# Patient Record
Sex: Female | Born: 1980 | Race: White | Hispanic: No | Marital: Single | Smoking: Current every day smoker
Health system: Southern US, Community
[De-identification: ages and names within clinical notes are randomized; demographics above are authoritative.]

## PROBLEM LIST (undated history)

## (undated) DIAGNOSIS — R768 Other specified abnormal immunological findings in serum: Secondary | ICD-10-CM

## (undated) DIAGNOSIS — Z9889 Other specified postprocedural states: Secondary | ICD-10-CM

## (undated) DIAGNOSIS — Z8742 Personal history of other diseases of the female genital tract: Secondary | ICD-10-CM

## (undated) DIAGNOSIS — M6282 Rhabdomyolysis: Secondary | ICD-10-CM

## (undated) DIAGNOSIS — F419 Anxiety disorder, unspecified: Secondary | ICD-10-CM

## (undated) DIAGNOSIS — F17200 Nicotine dependence, unspecified, uncomplicated: Secondary | ICD-10-CM

## (undated) DIAGNOSIS — K469 Unspecified abdominal hernia without obstruction or gangrene: Secondary | ICD-10-CM

## (undated) DIAGNOSIS — N72 Inflammatory disease of cervix uteri: Secondary | ICD-10-CM

## (undated) DIAGNOSIS — R569 Unspecified convulsions: Secondary | ICD-10-CM

## (undated) DIAGNOSIS — F1910 Other psychoactive substance abuse, uncomplicated: Secondary | ICD-10-CM

## (undated) DIAGNOSIS — D649 Anemia, unspecified: Secondary | ICD-10-CM

## (undated) DIAGNOSIS — N83209 Unspecified ovarian cyst, unspecified side: Secondary | ICD-10-CM

## (undated) HISTORY — DX: Nicotine dependence, unspecified, uncomplicated: F17.200

## (undated) HISTORY — PX: WISDOM TOOTH EXTRACTION: SHX21

## (undated) HISTORY — PX: TYMPANOSTOMY TUBE PLACEMENT: SHX32

---
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
## 2001-02-02 ENCOUNTER — Inpatient Hospital Stay (HOSPITAL_COMMUNITY): Admission: AD | Admit: 2001-02-02 | Discharge: 2001-02-02 | Payer: Self-pay | Admitting: Gynecology

## 2001-02-16 ENCOUNTER — Inpatient Hospital Stay (HOSPITAL_COMMUNITY): Admission: AD | Admit: 2001-02-16 | Discharge: 2001-02-19 | Payer: Self-pay | Admitting: Gynecology

## 2001-02-16 ENCOUNTER — Encounter: Payer: Self-pay | Admitting: Gynecology

## 2001-04-06 ENCOUNTER — Other Ambulatory Visit: Admission: RE | Admit: 2001-04-06 | Discharge: 2001-04-06 | Payer: Self-pay | Admitting: Obstetrics and Gynecology

## 2001-11-09 ENCOUNTER — Other Ambulatory Visit: Admission: RE | Admit: 2001-11-09 | Discharge: 2001-11-09 | Payer: Self-pay | Admitting: Obstetrics and Gynecology

## 2002-07-19 ENCOUNTER — Encounter: Payer: Self-pay | Admitting: Emergency Medicine

## 2002-07-19 ENCOUNTER — Emergency Department (HOSPITAL_COMMUNITY): Admission: EM | Admit: 2002-07-19 | Discharge: 2002-07-19 | Payer: Self-pay | Admitting: Emergency Medicine

## 2003-02-09 ENCOUNTER — Emergency Department (HOSPITAL_COMMUNITY): Admission: EM | Admit: 2003-02-09 | Discharge: 2003-02-09 | Payer: Self-pay | Admitting: Emergency Medicine

## 2003-04-01 ENCOUNTER — Emergency Department (HOSPITAL_COMMUNITY): Admission: EM | Admit: 2003-04-01 | Discharge: 2003-04-02 | Payer: Self-pay | Admitting: Emergency Medicine

## 2003-08-29 ENCOUNTER — Emergency Department (HOSPITAL_COMMUNITY): Admission: EM | Admit: 2003-08-29 | Discharge: 2003-08-29 | Payer: Self-pay | Admitting: Emergency Medicine

## 2004-02-09 ENCOUNTER — Emergency Department (HOSPITAL_COMMUNITY): Admission: EM | Admit: 2004-02-09 | Discharge: 2004-02-09 | Payer: Self-pay | Admitting: Emergency Medicine

## 2004-09-09 ENCOUNTER — Emergency Department (HOSPITAL_COMMUNITY): Admission: EM | Admit: 2004-09-09 | Discharge: 2004-09-10 | Payer: Self-pay | Admitting: Emergency Medicine

## 2004-10-06 ENCOUNTER — Emergency Department (HOSPITAL_COMMUNITY): Admission: EM | Admit: 2004-10-06 | Discharge: 2004-10-06 | Payer: Self-pay | Admitting: Emergency Medicine

## 2005-07-22 ENCOUNTER — Inpatient Hospital Stay (HOSPITAL_COMMUNITY): Admission: EM | Admit: 2005-07-22 | Discharge: 2005-07-23 | Payer: Self-pay | Admitting: Emergency Medicine

## 2005-07-22 ENCOUNTER — Encounter: Payer: Self-pay | Admitting: Emergency Medicine

## 2005-08-25 ENCOUNTER — Emergency Department (HOSPITAL_COMMUNITY): Admission: EM | Admit: 2005-08-25 | Discharge: 2005-08-25 | Payer: Self-pay | Admitting: *Deleted

## 2010-05-22 ENCOUNTER — Emergency Department (HOSPITAL_COMMUNITY)
Admission: EM | Admit: 2010-05-22 | Discharge: 2010-05-22 | Disposition: A | Discharge status: Home / Self Care | Payer: Self-pay | Attending: Emergency Medicine | Admitting: Emergency Medicine

## 2010-05-22 DIAGNOSIS — IMO0001 Reserved for inherently not codable concepts without codable children: Secondary | ICD-10-CM | POA: Insufficient documentation

## 2010-05-22 DIAGNOSIS — M79609 Pain in unspecified limb: Secondary | ICD-10-CM | POA: Insufficient documentation

## 2010-08-13 ENCOUNTER — Emergency Department (HOSPITAL_COMMUNITY)
Admission: EM | Admit: 2010-08-13 | Discharge: 2010-08-13 | Disposition: A | Discharge status: Home / Self Care | Payer: Self-pay | Attending: Emergency Medicine | Admitting: Emergency Medicine

## 2010-08-13 ENCOUNTER — Emergency Department (HOSPITAL_COMMUNITY): Payer: Self-pay

## 2010-08-13 ENCOUNTER — Encounter (HOSPITAL_COMMUNITY): Payer: Self-pay

## 2010-08-13 DIAGNOSIS — R229 Localized swelling, mass and lump, unspecified: Secondary | ICD-10-CM | POA: Insufficient documentation

## 2010-08-13 DIAGNOSIS — R51 Headache: Secondary | ICD-10-CM | POA: Insufficient documentation

## 2010-08-13 DIAGNOSIS — F17200 Nicotine dependence, unspecified, uncomplicated: Secondary | ICD-10-CM | POA: Insufficient documentation

## 2010-08-13 LAB — POCT I-STAT, CHEM 8
Calcium, Ion: 1.22 mmol/L (ref 1.12–1.32)
Chloride: 104 mEq/L (ref 96–112)
Creatinine, Ser: 0.7 mg/dL (ref 0.4–1.2)
Glucose, Bld: 92 mg/dL (ref 70–99)
HCT: 43 % (ref 36.0–46.0)
Hemoglobin: 14.6 g/dL (ref 12.0–15.0)
Potassium: 4.2 mEq/L (ref 3.5–5.1)
TCO2: 26 mmol/L (ref 0–100)

## 2010-08-13 LAB — DIFFERENTIAL
Basophils Absolute: 0.1 10*3/uL (ref 0.0–0.1)
Eosinophils Absolute: 0.2 10*3/uL (ref 0.0–0.7)
Eosinophils Relative: 2 % (ref 0–5)
Lymphocytes Relative: 32 % (ref 12–46)
Lymphs Abs: 3.5 10*3/uL (ref 0.7–4.0)
Neutro Abs: 6.4 10*3/uL (ref 1.7–7.7)
Neutrophils Relative %: 58 % (ref 43–77)

## 2010-08-13 LAB — CBC
RDW: 13.4 % (ref 11.5–15.5)
WBC: 11 10*3/uL — ABNORMAL HIGH (ref 4.0–10.5)

## 2010-08-31 ENCOUNTER — Emergency Department (HOSPITAL_COMMUNITY)
Admission: EM | Admit: 2010-08-31 | Discharge: 2010-09-01 | Disposition: A | Discharge status: Home / Self Care | Payer: Self-pay | Attending: Emergency Medicine | Admitting: Emergency Medicine

## 2010-08-31 ENCOUNTER — Emergency Department (HOSPITAL_COMMUNITY): Payer: Self-pay

## 2010-08-31 DIAGNOSIS — N83209 Unspecified ovarian cyst, unspecified side: Secondary | ICD-10-CM | POA: Insufficient documentation

## 2010-08-31 DIAGNOSIS — R6883 Chills (without fever): Secondary | ICD-10-CM | POA: Insufficient documentation

## 2010-08-31 DIAGNOSIS — N949 Unspecified condition associated with female genital organs and menstrual cycle: Secondary | ICD-10-CM | POA: Insufficient documentation

## 2010-08-31 DIAGNOSIS — R110 Nausea: Secondary | ICD-10-CM | POA: Insufficient documentation

## 2010-08-31 DIAGNOSIS — R1032 Left lower quadrant pain: Secondary | ICD-10-CM | POA: Insufficient documentation

## 2010-08-31 DIAGNOSIS — B9689 Other specified bacterial agents as the cause of diseases classified elsewhere: Secondary | ICD-10-CM | POA: Insufficient documentation

## 2010-08-31 DIAGNOSIS — R300 Dysuria: Secondary | ICD-10-CM | POA: Insufficient documentation

## 2010-08-31 DIAGNOSIS — A499 Bacterial infection, unspecified: Secondary | ICD-10-CM | POA: Insufficient documentation

## 2010-08-31 DIAGNOSIS — N760 Acute vaginitis: Secondary | ICD-10-CM | POA: Insufficient documentation

## 2010-08-31 LAB — CBC
HCT: 36.7 % (ref 36.0–46.0)
Hemoglobin: 12.6 g/dL (ref 12.0–15.0)
MCH: 29.7 pg (ref 26.0–34.0)
MCHC: 34.3 g/dL (ref 30.0–36.0)
MCV: 86.6 fL (ref 78.0–100.0)
Platelets: 200 10*3/uL (ref 150–400)
RDW: 13.5 % (ref 11.5–15.5)

## 2010-08-31 LAB — WET PREP, GENITAL
Trich, Wet Prep: NONE SEEN
Yeast Wet Prep HPF POC: NONE SEEN

## 2010-08-31 LAB — BASIC METABOLIC PANEL
Chloride: 102 mEq/L (ref 96–112)
Glucose, Bld: 86 mg/dL (ref 70–99)
Sodium: 138 mEq/L (ref 135–145)

## 2010-08-31 LAB — DIFFERENTIAL
Basophils Relative: 1 % (ref 0–1)
Eosinophils Absolute: 0.2 10*3/uL (ref 0.0–0.7)
Lymphs Abs: 3.7 10*3/uL (ref 0.7–4.0)
Monocytes Absolute: 0.7 10*3/uL (ref 0.1–1.0)

## 2010-09-01 ENCOUNTER — Emergency Department (HOSPITAL_COMMUNITY): Payer: Self-pay

## 2010-09-01 ENCOUNTER — Encounter (HOSPITAL_COMMUNITY): Payer: Self-pay | Admitting: Radiology

## 2010-09-01 LAB — GC/CHLAMYDIA PROBE AMP, GENITAL: Chlamydia, DNA Probe: NEGATIVE

## 2010-09-01 LAB — URINALYSIS, ROUTINE W REFLEX MICROSCOPIC
Bilirubin Urine: NEGATIVE
Hgb urine dipstick: NEGATIVE
Ketones, ur: 40 mg/dL — AB
Nitrite: NEGATIVE
Urobilinogen, UA: 0.2 mg/dL (ref 0.0–1.0)
pH: 7.5 (ref 5.0–8.0)

## 2010-09-01 MED ORDER — IOHEXOL 300 MG/ML  SOLN
80.0000 mL | Freq: Once | INTRAMUSCULAR | Status: AC | PRN
  Administered 2010-09-01: 80 mL via INTRAVENOUS

## 2010-10-28 ENCOUNTER — Inpatient Hospital Stay (HOSPITAL_COMMUNITY)
Admission: AD | Admit: 2010-10-28 | Discharge: 2010-10-28 | Disposition: A | Discharge status: Home / Self Care | Payer: Self-pay | Source: Ambulatory Visit | Attending: Gynecology | Admitting: Gynecology

## 2010-10-28 ENCOUNTER — Inpatient Hospital Stay (HOSPITAL_COMMUNITY): Payer: Self-pay

## 2010-10-28 ENCOUNTER — Encounter (HOSPITAL_COMMUNITY): Payer: Self-pay | Admitting: *Deleted

## 2010-10-28 DIAGNOSIS — R110 Nausea: Secondary | ICD-10-CM | POA: Insufficient documentation

## 2010-10-28 DIAGNOSIS — M545 Low back pain, unspecified: Secondary | ICD-10-CM | POA: Insufficient documentation

## 2010-10-28 DIAGNOSIS — R1032 Left lower quadrant pain: Secondary | ICD-10-CM | POA: Insufficient documentation

## 2010-10-28 DIAGNOSIS — N83209 Unspecified ovarian cyst, unspecified side: Secondary | ICD-10-CM

## 2010-10-28 DIAGNOSIS — F17200 Nicotine dependence, unspecified, uncomplicated: Secondary | ICD-10-CM | POA: Insufficient documentation

## 2010-10-28 LAB — WET PREP, GENITAL
Trich, Wet Prep: NONE SEEN
Yeast Wet Prep HPF POC: NONE SEEN

## 2010-10-28 LAB — URINALYSIS, ROUTINE W REFLEX MICROSCOPIC
Bilirubin Urine: NEGATIVE
Glucose, UA: NEGATIVE mg/dL
Hgb urine dipstick: NEGATIVE
Ketones, ur: NEGATIVE mg/dL
Protein, ur: NEGATIVE mg/dL
pH: 6.5 (ref 5.0–8.0)

## 2010-10-28 LAB — CBC
MCH: 29.6 pg (ref 26.0–34.0)
RBC: 4.09 MIL/uL (ref 3.87–5.11)
RDW: 13.2 % (ref 11.5–15.5)
WBC: 13.2 10*3/uL — ABNORMAL HIGH (ref 4.0–10.5)

## 2010-10-28 LAB — POCT PREGNANCY, URINE: Preg Test, Ur: NEGATIVE

## 2010-10-28 MED ORDER — IBUPROFEN 600 MG PO TABS: 600.0000 mg | ORAL_TABLET | Freq: Four times a day (QID) | ORAL | Status: AC | PRN

## 2010-10-28 MED ORDER — OXYCODONE-ACETAMINOPHEN 5-325 MG PO TABS
1.0000 | ORAL_TABLET | Freq: Four times a day (QID) | ORAL | Status: AC | PRN
Start: 2010-10-28 — End: 2010-11-07

## 2010-10-28 MED ORDER — OXYCODONE-ACETAMINOPHEN 5-325 MG PO TABS
2.0000 | ORAL_TABLET | Freq: Once | ORAL | Status: AC
  Administered 2010-10-28: 2 via ORAL
  Filled 2010-10-28: qty 2

## 2010-10-28 MED ORDER — KETOROLAC TROMETHAMINE 60 MG/2ML IM SOLN
60.0000 mg | Freq: Once | INTRAMUSCULAR | Status: AC
  Administered 2010-10-28: 60 mg via INTRAMUSCULAR
  Filled 2010-10-28: qty 2

## 2010-10-28 NOTE — ED Notes (Signed)
Pt dozing and feels some better. Abdomen is "sore".

## 2010-10-28 NOTE — Progress Notes (Signed)
G1P1 hx C/S 2002. Hx ovarian cysts. Seen [facility] 2mos ago and told had "chocolate cyst". Awoke with pain LLQ which radiates L hip and L lower back. Sharp and crampy. Feels like pain from cyst. LMP 7/6. Has tried Lortabs which help make pain tolerable.

## 2010-10-28 NOTE — ED Notes (Signed)
0420Written and verbal d/c instructions given and understanding vocied. To f/u with MD's office on Monday.

## 2010-10-28 NOTE — ED Notes (Signed)
Ice to right hip where hip is sore from Toradol inj.

## 2010-10-28 NOTE — ED Provider Notes (Signed)
Adequate pain relief w/ Percocet.  Reviewed [place]. D/C home.

## 2010-10-28 NOTE — Progress Notes (Signed)
Dorathy Kinsman CNM in to see pt. Spec exam done and wet prep and GC/Chlam obtained.

## 2010-10-28 NOTE — ED Provider Notes (Signed)
History   The pt is a 30 year-old female who presents to [facility]U reporting LLQ and left low back pain since 0900 10/27/10 similar to ovarian cyst pain she has experienced before, but lasting longer. Pain was relieved by Lortab yesterday, but she ran out.  She called the on-call provider for pain meds and he told her to go to [facility]U for eval. Pt denies high-risk sexual behavior.  Chief Complaint  Patient presents with  . Abdominal Pain   HPI   Past Medical History  Diagnosis Date  . Cyst of ovary     Past Surgical History  Procedure Date  . Cesarean section     No family history on file.  History  Substance Use Topics  . Smoking status: Current Everyday Smoker -- 0.5 packs/day  . Smokeless tobacco: Not on file  . Alcohol Use: No    Allergies: No Known Allergies  Prescriptions prior to admission  Medication Sig Dispense Refill  . HYDROcodone-acetaminophen (LORTAB) 7.5-500 MG per tablet Take 1 tablet by mouth every 6 (six) hours as needed. For pain       . lidocaine (XYLOCAINE) 2 % jelly Apply 1 application topically as needed.          Review of Systems  Constitutional: Negative.   Gastrointestinal: Positive for nausea (mild x 1 day) and abdominal pain. Negative for vomiting, diarrhea, constipation and blood in stool.  Musculoskeletal: Positive for back pain.   Physical Exam   Blood pressure 132/91, pulse 81, temperature 98 F (36.7 C), temperature source Oral, resp. rate 20, height 5\' 5"  (1.651 m), weight 55.702 kg (122 lb 12.8 oz).  *RADIOLOGY REPORT*  Clinical Data: Left lower quadrant pain, history of ovarian cyst.  TRANSVAGINAL ULTRASOUND OF PELVIS  Technique: Transvaginal ultrasound examination of the pelvis was  performed including evaluation of the uterus, ovaries, adnexal  regions, and pelvic cul-de-sac.  Comparison: 09/01/2010  Findings:  Uterus the uterus appears normal, measuring 6.2 x 4.1 x 3.5 cm.  Retroverted.  Endometrium normal endometrial thickness and  appearance, measuring  7 mm.  Right Ovary the right ovary is normal in size and echogenicity,  measuring 3.3 x 1.3 x 1.3 cm.  Left Ovary the left ovary measures 4.2 x 1.8 x 2.3 cm.  Multiloculated cystic area arising from or adjacent to the left  ovary is without significant interval change, measuring 2.1 x 3.5  cm. Within the left ovary is a 2.0 x 1.5 cm echogenic focus which  may represent a hemorrhagic cyst.  Other Findings: Trace free fluid.  IMPRESSION:  Unchanged appearance of the complex left adnexal cyst, measures 3.5  x 2.1 cm. Again may represent a hemorrhagic cyst.  Original Report Authenticated By: Waneta Martins, M.D.  Results for orders placed during the hospital encounter of 10/28/10 (from the past 24 hour(s))  POCT PREGNANCY, URINE     Status: Normal   Collection Time   10/28/10 12:59 AM      Component Value Range   Preg Test, Ur NEGATIVE    URINALYSIS, ROUTINE W REFLEX MICROSCOPIC     Status: Normal   Collection Time   10/28/10  1:00 AM      Component Value Range   Color, Urine YELLOW  YELLOW    Appearance CLEAR  CLEAR    Specific Gravity, Urine 1.015  1.005 - 1.030    pH 6.5  5.0 - 8.0    Glucose, UA NEGATIVE  NEGATIVE (mg/dL)   Hgb urine dipstick NEGATIVE  NEGATIVE    Bilirubin Urine NEGATIVE  NEGATIVE    Ketones, ur NEGATIVE  NEGATIVE (mg/dL)   Protein, ur NEGATIVE  NEGATIVE (mg/dL)   Urobilinogen, UA 0.2  0.0 - 1.0 (mg/dL)   Nitrite NEGATIVE  NEGATIVE    Leukocytes, UA NEGATIVE  NEGATIVE   CBC     Status: Abnormal   Collection Time   10/28/10  1:37 AM      Component Value Range   WBC 13.2 (*) 4.0 - 10.5 (K/uL)   RBC 4.09  3.87 - 5.11 (MIL/uL)   Hemoglobin 12.1  12.0 - 15.0 (g/dL)   HCT 69.6 (*) 29.5 - 46.0 (%)   MCV 87.5  78.0 - 100.0 (fL)   MCH 29.6  26.0 - 34.0 (pg)   MCHC 33.8  30.0 - 36.0 (g/dL)   RDW 28.4  13.2 - 44.0 (%)   Platelets 210  150 - 400 (K/uL)  WET PREP, GENITAL     Status: Abnormal   Collection Time   10/28/10  2:45 AM       Component Value Range   Yeast, Wet Prep NONE SEEN  NONE SEEN    Trich, Wet Prep NONE SEEN  NONE SEEN    Clue Cells, Wet Prep NONE SEEN  NONE SEEN    WBC, Wet Prep HPF POC FEW (*) NONE SEEN    Physical Exam  Constitutional: She is oriented to person, place, and time. She appears well-developed and well-nourished.  HENT:  Head: Normocephalic.  Cardiovascular: Normal rate.   Respiratory: Effort normal and breath sounds normal.  GI: Soft. Bowel sounds are normal. She exhibits no distension and no mass. There is tenderness. There is guarding. There is no rebound.  Genitourinary: Vagina normal and uterus normal. No vaginal discharge found.  Neurological: She is alert and oriented to person, place, and time.  Skin: Skin is warm and dry.    MAU Course  Procedures   Assessment and Plan  Assessement: 1. Ovarian cyst c/w previous imaging. 2. Pain not relieved w/ Toradal  Plan 1. D/C home per consult w/ Dr. Nicholas Lose 2. GC/CT cultures 3. Percocet 5/325 2 tabs now, Rx 1-2 Q6 PRN, #20.  4. F/U w/ Dr. Dianne Dun for management.  Graciela Plato 10/28/2010, 2:54 AM

## 2010-10-28 NOTE — ED Notes (Signed)
Pt is resting. States hip soreness is much better.

## 2010-10-28 NOTE — ED Notes (Signed)
0410 Dorathy Kinsman CNM in to discuss u/s results, labs, and follow-up with pt.

## 2010-10-28 NOTE — ED Notes (Signed)
Pt to  U/s via w/c

## 2010-10-29 LAB — GC/CHLAMYDIA PROBE AMP, GENITAL: Chlamydia, DNA Probe: NEGATIVE

## 2010-11-16 ENCOUNTER — Ambulatory Visit: Payer: Self-pay | Admitting: Gynecology

## 2010-11-22 ENCOUNTER — Emergency Department (HOSPITAL_COMMUNITY)
Admission: EM | Admit: 2010-11-22 | Discharge: 2010-11-22 | Disposition: A | Discharge status: Home / Self Care | Payer: Self-pay | Attending: Emergency Medicine | Admitting: Emergency Medicine

## 2010-11-22 ENCOUNTER — Encounter (HOSPITAL_COMMUNITY): Payer: Self-pay | Admitting: Radiology

## 2010-11-22 ENCOUNTER — Emergency Department (HOSPITAL_COMMUNITY): Payer: Self-pay

## 2010-11-22 DIAGNOSIS — A499 Bacterial infection, unspecified: Secondary | ICD-10-CM | POA: Insufficient documentation

## 2010-11-22 DIAGNOSIS — N760 Acute vaginitis: Secondary | ICD-10-CM | POA: Insufficient documentation

## 2010-11-22 DIAGNOSIS — N83209 Unspecified ovarian cyst, unspecified side: Secondary | ICD-10-CM | POA: Insufficient documentation

## 2010-11-22 DIAGNOSIS — R110 Nausea: Secondary | ICD-10-CM | POA: Insufficient documentation

## 2010-11-22 DIAGNOSIS — R1031 Right lower quadrant pain: Secondary | ICD-10-CM | POA: Insufficient documentation

## 2010-11-22 DIAGNOSIS — B9689 Other specified bacterial agents as the cause of diseases classified elsewhere: Secondary | ICD-10-CM | POA: Insufficient documentation

## 2010-11-22 HISTORY — DX: Unspecified ovarian cyst, unspecified side: N83.209

## 2010-11-22 HISTORY — DX: Inflammatory disease of cervix uteri: N72

## 2010-11-22 LAB — CBC
HCT: 40.9 % (ref 36.0–46.0)
Hemoglobin: 14 g/dL (ref 12.0–15.0)
MCH: 29.6 pg (ref 26.0–34.0)
MCHC: 34.2 g/dL (ref 30.0–36.0)
RDW: 13.2 % (ref 11.5–15.5)

## 2010-11-22 LAB — URINALYSIS, ROUTINE W REFLEX MICROSCOPIC
Bilirubin Urine: NEGATIVE
Hgb urine dipstick: NEGATIVE
Ketones, ur: NEGATIVE mg/dL
Nitrite: NEGATIVE
Urobilinogen, UA: 0.2 mg/dL (ref 0.0–1.0)

## 2010-11-22 LAB — WET PREP, GENITAL
Trich, Wet Prep: NONE SEEN
Yeast Wet Prep HPF POC: NONE SEEN

## 2010-11-22 LAB — COMPREHENSIVE METABOLIC PANEL
Albumin: 4.2 g/dL (ref 3.5–5.2)
BUN: 9 mg/dL (ref 6–23)
Calcium: 9.8 mg/dL (ref 8.4–10.5)
Creatinine, Ser: 0.57 mg/dL (ref 0.50–1.10)
GFR calc Af Amer: >60 mL/min (ref >60)
Glucose, Bld: 86 mg/dL (ref 70–99)
Potassium: 3.6 mEq/L (ref 3.5–5.1)
Total Protein: 7.4 g/dL (ref 6.0–8.3)

## 2010-11-22 LAB — DIFFERENTIAL
Basophils Absolute: 0.1 10*3/uL (ref 0.0–0.1)
Eosinophils Relative: 1 % (ref 0–5)
Lymphocytes Relative: 31 % (ref 12–46)
Monocytes Absolute: 0.6 10*3/uL (ref 0.1–1.0)
Monocytes Relative: 6 % (ref 3–12)
Neutro Abs: 5.6 10*3/uL (ref 1.7–7.7)

## 2010-11-22 LAB — POCT PREGNANCY, URINE: Preg Test, Ur: NEGATIVE

## 2010-11-22 LAB — LIPASE, BLOOD: Lipase: 23 U/L (ref 11–59)

## 2010-11-22 MED ORDER — IOHEXOL 300 MG/ML  SOLN
80.0000 mL | Freq: Once | INTRAMUSCULAR | Status: AC | PRN
  Administered 2010-11-22: 80 mL via INTRAVENOUS

## 2011-03-11 ENCOUNTER — Ambulatory Visit (INDEPENDENT_AMBULATORY_CARE_PROVIDER_SITE_OTHER): Payer: BC Managed Care – PPO | Admitting: Gynecology

## 2011-03-11 ENCOUNTER — Ambulatory Visit (INDEPENDENT_AMBULATORY_CARE_PROVIDER_SITE_OTHER): Payer: BC Managed Care – PPO

## 2011-03-11 ENCOUNTER — Encounter: Payer: Self-pay | Admitting: Gynecology

## 2011-03-11 VITALS — BP 110/70 | Temp 97.6°F | Ht 65.5 in | Wt 128.0 lb

## 2011-03-11 DIAGNOSIS — IMO0002 Reserved for concepts with insufficient information to code with codable children: Secondary | ICD-10-CM

## 2011-03-11 DIAGNOSIS — N941 Unspecified dyspareunia: Secondary | ICD-10-CM

## 2011-03-11 DIAGNOSIS — F17200 Nicotine dependence, unspecified, uncomplicated: Secondary | ICD-10-CM

## 2011-03-11 DIAGNOSIS — N946 Dysmenorrhea, unspecified: Secondary | ICD-10-CM

## 2011-03-11 DIAGNOSIS — N920 Excessive and frequent menstruation with regular cycle: Secondary | ICD-10-CM

## 2011-03-11 DIAGNOSIS — R102 Pelvic and perineal pain: Secondary | ICD-10-CM

## 2011-03-11 DIAGNOSIS — N83209 Unspecified ovarian cyst, unspecified side: Secondary | ICD-10-CM

## 2011-03-11 DIAGNOSIS — N831 Corpus luteum cyst of ovary, unspecified side: Secondary | ICD-10-CM

## 2011-03-11 DIAGNOSIS — N949 Unspecified condition associated with female genital organs and menstrual cycle: Secondary | ICD-10-CM

## 2011-03-11 MED ORDER — DESOGESTREL-ETHINYL ESTRADIOL 0.15-0.02/0.01 MG (21/5) PO TABS: 1.0000 | ORAL_TABLET | Freq: Every day | ORAL | Status: DC

## 2011-03-11 MED ORDER — KETOROLAC TROMETHAMINE 30 MG/ML IJ SOLN
30.0000 mg | Freq: Once | INTRAMUSCULAR | Status: AC
  Administered 2011-03-11: 30 mg via INTRAMUSCULAR

## 2011-03-11 NOTE — Progress Notes (Signed)
Patient is a 30 year old gravida 1 para 1 ( prior cesarean section) presented to the office today complaining of left lower quadrant pain for the past week on and off. She said her menstrual cycle started today. For the past year she has suffered from dyspareunia. Her last gynecological exam was in [place] a year ago she said it was normal. She's not using any form of contraception. She has visited the emergency room twice do to pelvic pain. The first visit was in May 2012 whereby an abdominal pelvic CT scan and ultrasound had been done and a 3.9 x 2.4 apparent hemorrhagic cyst was diagnosed. Then in August of 2012 she was complaining of pelvic pain an ultrasound demonstrated right ovarian cyst which measured 4.7 x 2.2 cm. She's not using any form of contraception she does smoke a few cigarettes per day. Patient denies any prior history of any STD or PID. Patient denies any prior pelvic surgery with the exception of the cesarean sections she had 10 years ago.  Exam: Blood pressure 110/70   temperature 97.6 Abdomen: Soft slightly tender left lower quadrant with mild guarding Pelvic: Bartholin urethra Skene was within normal limits Vagina: No gross lesions on inspection Cervix: No gross lesions on inspection Uterus: Limited due to patient's been guarding and and discomfort in the left lower quadrant Adnexa: Difficult to assess due to patient discomfort on the left lower quadrant Rectal exam: Not done  Ultrasound today demonstrated uterus to measure 6.6 x 4.6 x 4.1 cm endometrial stripe 6.1 mm right ovary was normal left ovary had a thick wall cyst measuring 36 x 17 x 27 mm reticular echo pattern with positive color flow the periphery There was no fluid noted in the right ovary.  Urine pregnancy test in the office was negative CBC with a white blood count of 8.1 hemoglobin and hematocrit 13.1 and 30.1 respectively with a platelet count 225,000.  Assessment: History of recurrent ovarian cysts,  dysmenorrhea, dyspareunia, and menorrhagia,. We discussed starting her on oral contraceptive pills to regulate her cycle cut down on her pelvic pain as well as heavy periods and suppression of ovarian cysts. If her symptoms do not resolve we discussed about proceeding with diagnostic laparoscopy possible ovarian cystectomy. She will start on the Kariva oral contraceptive pill tomorrow and she will return back to the office on the start of her third pack of oral contraceptive pill for an ultrasound. She had been on birth control pills many years ago without any problems. Nevertheless we discussed the risks benefits and pros and cons oral contraceptive pill especially that she smokes she is at increased risk for DVT and pulmonary embolism she fully understands and accepts. When she returned back in 2 months we'll do her annual gynecological exam which is overdue as well.

## 2011-03-12 ENCOUNTER — Other Ambulatory Visit: Payer: Self-pay | Admitting: *Deleted

## 2011-03-12 DIAGNOSIS — N83209 Unspecified ovarian cyst, unspecified side: Secondary | ICD-10-CM

## 2011-03-12 DIAGNOSIS — N946 Dysmenorrhea, unspecified: Secondary | ICD-10-CM

## 2011-03-12 DIAGNOSIS — N920 Excessive and frequent menstruation with regular cycle: Secondary | ICD-10-CM

## 2011-03-12 MED ORDER — DESOGESTREL-ETHINYL ESTRADIOL 0.15-0.02/0.01 MG (21/5) PO TABS: 1.0000 | ORAL_TABLET | Freq: Every day | ORAL | Status: DC

## 2011-03-15 ENCOUNTER — Telehealth: Payer: Self-pay | Admitting: *Deleted

## 2011-03-15 MED ORDER — NORGESTIMATE-ETH ESTRADIOL 0.25-35 MG-MCG PO TABS: 1.0000 | ORAL_TABLET | Freq: Every day | ORAL | Status: DC

## 2011-03-15 NOTE — Telephone Encounter (Signed)
Due to cost patient will be placed on Ortho Tri-Cyclen 30 year old contraceptive pill and will be called in to her pharmacy.

## 2011-03-15 NOTE — Telephone Encounter (Signed)
Lm at home number with information below.

## 2011-03-15 NOTE — Telephone Encounter (Signed)
Patient was informed her birth control pills were not covered with her insurance company at all.  Apparently no birth control coverage with plan.  Patient said she could afford $10 cash price ortho trycylen at [facility].  Would that be an option for her?

## 2011-03-25 ENCOUNTER — Other Ambulatory Visit: Payer: Self-pay | Admitting: *Deleted

## 2011-03-25 MED ORDER — HYDROCODONE-ACETAMINOPHEN 7.5-500 MG PO TABS: 1.0000 | ORAL_TABLET | Freq: Four times a day (QID) | ORAL | Status: AC | PRN

## 2011-03-25 NOTE — Telephone Encounter (Signed)
rx called in

## 2011-03-26 ENCOUNTER — Encounter: Payer: Self-pay | Admitting: *Deleted

## 2011-03-26 NOTE — Progress Notes (Signed)
Patient's refill for Lortab 7.5 was called into [facility] yesterday but was supposed to be [facility] in [place].  Called into correct pharmacy

## 2011-04-04 ENCOUNTER — Other Ambulatory Visit: Payer: Self-pay | Admitting: *Deleted

## 2011-04-19 ENCOUNTER — Ambulatory Visit (INDEPENDENT_AMBULATORY_CARE_PROVIDER_SITE_OTHER): Payer: BC Managed Care – PPO | Admitting: Gynecology

## 2011-04-19 ENCOUNTER — Ambulatory Visit (INDEPENDENT_AMBULATORY_CARE_PROVIDER_SITE_OTHER): Payer: BC Managed Care – PPO

## 2011-04-19 ENCOUNTER — Encounter: Payer: Self-pay | Admitting: Gynecology

## 2011-04-19 VITALS — BP 130/86

## 2011-04-19 DIAGNOSIS — Z3009 Encounter for other general counseling and advice on contraception: Secondary | ICD-10-CM

## 2011-04-19 DIAGNOSIS — Z30011 Encounter for initial prescription of contraceptive pills: Secondary | ICD-10-CM

## 2011-04-19 DIAGNOSIS — Z8742 Personal history of other diseases of the female genital tract: Secondary | ICD-10-CM

## 2011-04-19 DIAGNOSIS — Z113 Encounter for screening for infections with a predominantly sexual mode of transmission: Secondary | ICD-10-CM

## 2011-04-19 DIAGNOSIS — R1030 Lower abdominal pain, unspecified: Secondary | ICD-10-CM

## 2011-04-19 DIAGNOSIS — R109 Unspecified abdominal pain: Secondary | ICD-10-CM

## 2011-04-19 DIAGNOSIS — N898 Other specified noninflammatory disorders of vagina: Secondary | ICD-10-CM

## 2011-04-19 LAB — URINALYSIS W MICROSCOPIC + REFLEX CULTURE
Bilirubin Urine: NEGATIVE
Crystals: NONE SEEN
Nitrite: NEGATIVE
Protein, ur: NEGATIVE mg/dL
Specific Gravity, Urine: 1.025 (ref 1.005–1.030)
Urobilinogen, UA: 0.2 mg/dL (ref 0.0–1.0)

## 2011-04-19 LAB — WET PREP FOR TRICH, YEAST, CLUE: Trich, Wet Prep: NONE SEEN

## 2011-04-19 LAB — PREGNANCY, URINE: Preg Test, Ur: NEGATIVE

## 2011-04-19 MED ORDER — HYDROCODONE-ACETAMINOPHEN 7.5-300 MG PO TABS: 7.5000 mg | ORAL_TABLET | Freq: Four times a day (QID) | ORAL | Status: DC | PRN

## 2011-04-19 MED ORDER — FLUCONAZOLE 100 MG PO TABS: 100.0000 mg | ORAL_TABLET | Freq: Every day | ORAL | Status: DC

## 2011-04-19 MED ORDER — METOCLOPRAMIDE HCL 10 MG PO TABS: 10.0000 mg | ORAL_TABLET | Freq: Three times a day (TID) | ORAL | Status: DC

## 2011-04-19 NOTE — Progress Notes (Signed)
Tammy Mathis is an 30 y.o. gravida 1 para 1 (prior cesarean section) presented to the office today with worsening left lower quadrant pain. Patient was seen the office on December 3 with similar symptoms. She was having normal menstrual cycle and also had some dyspareunia. Her last Pap smear was in [place] last year reported to be normal she was currently not using any form of contraception. She is gone to the emergency room twice for similar symptoms in May of 2012 had a CT scan and they diagnosed a small hemorrhagic cyst measuring 3.9 x 2.4 cm. In August of 2012 she was complaining simple her pain was seen elsewhere and was found to have a cyst that measured 4.7 x 2.2 cm. When she was seen in the office on December 3 her uterus is normal size shape and normal right ovary and a left ovarian cyst was now measuring 3.6 x 1.7 x 2.7 mm reticular echo pattern with positive color flow in the periphery. Today's ultrasound in the office had demonstrated that her cyst had increased in size to 5.1 x 4.6 x 4.7 mm thin wall a vascular with internal low level echoes and small amount of free fluid was seen. Right or rib was normal. Patient did not started the birth control pill previously prescribed last month to suppress her cyst because of cost and was switched to Ortho Tri-Cyclen was waiting for the cycle to start in 2 weeks to restart the oral contraceptive pill.  Pertinent Gynecological History: Menses: Regular heavy with cramps Bleeding: As above Contraception: none DES exposure: denies Blood transfusions: none Sexually transmitted diseases: no past history Previous GYN Procedures: LEEP cervical conization  Last mammogram: Not indicated Date: Not indicated Last pap: normal Date: 2012 in [place] OB History: G1, P1   Menstrual History: Menarche age: 13 Patient's last menstrual period was 04/04/2011.    Past Medical History  Diagnosis Date  . Cyst of ovary   . Smoker     5 CIGS A DAY  .  Endometriosis   . Ovarian cyst   . Cervicitis     Past Surgical History  Procedure Date  . Cervical biopsy  w/ loop electrode excision 02/2000  . Cesarean section 2002    Family History  Problem Relation Age of Onset  . Adopted: Yes    Social History:  reports that she has been smoking Cigarettes.  She has been smoking about .3 packs per day. She has never used smokeless tobacco. She reports that she does not drink alcohol or use illicit drugs.  Allergies:  Allergies  Allergen Reactions  . Benzoyl Peroxide Swelling     (Not in a hospital admission)  @ROS@  Blood pressure 130/86, last menstrual period 04/04/2011.  Physical Exam:  HEENT:unremarkable Neck:Supple, midline, no thyroid megaly, no carotid bruits Lungs:  Clear to auscultation no rhonchi's or wheezes Heart:Regular rate and rhythm, no murmurs or gallops Breast Exam: Not done Abdomen: Soft tender left lower quadrant no rebound Pelvic:BUS within normal limits  Vagina: No gross lesions on inspection slight white discharge Cervix: No lesions Uterus: Anteverted Adnexa: Fullness and tenderness in the left adnexa Extremities: No cords, no edema Rectal: Not done  Results for orders placed in visit on 04/19/11 (from the past 24 hour(s))  WET PREP FOR TRICH, YEAST, CLUE     Status: Normal   Collection Time   04/19/11  3:58 PM      Component Value Range   Yeast, Wet Prep FEW  NONE SEEN      Trich, Wet Prep NONE SEEN  NONE SEEN    Clue Cells, Wet Prep NONE SEEN  NONE SEEN    WBC, Wet Prep HPF POC FEW  NONE SEEN    Narrative:    Performed at:  [facility]                [address]                [place], [place] [address]  URINALYSIS WITH CULTURE REFLEX     Status: Abnormal   Collection Time   04/19/11  4:01 PM      Component Value Range   Color, Urine YELLOW  YELLOW    APPearance HAZY  CLEAR    Specific Gravity, Urine 1.025  1.005 - 1.030    pH 6.0  5.0 - 8.0    Glucose, UA NEG  NEG  (mg/dL)   Bilirubin Urine NEG  NEG    Ketones, ur TRACE (*) NEG (mg/dL)   Hgb urine dipstick TRACE (*) NEG    Protein, ur NEG  NEG (mg/dL)   Urobilinogen, UA 0.2  0.0 - 1.0 (mg/dL)   Nitrite NEG  NEG    Leukocytes, UA NEG  NEG    Squamous Epithelial / LPF RARE  RARE    Crystals NONE SEEN  NEG    Casts NONE SEEN  NEG    WBC, UA NONE SEEN  <3 (WBC/hpf)   RBC / HPF 0-2  <3 (RBC/hpf)   Bacteria, UA RARE  RARE    Narrative:    Performed at:  [facility]                [address]                [place], [place] [address]  PREGNANCY, URINE     Status: Normal   Collection Time   04/19/11  4:01 PM      Component Value Range   Preg Test, Ur NEG     Narrative:    Performed at:  [facility]                [address]                [place], [place] [address]    No results found.  Assessment/Plan: 30-year-old gravida 1 para 1 with persistent left ovarian cyst had gotten larger and more painful. Patient will be scheduled for next week to undergo a laparoscopic ovarian cystectomy possible left salpingo-oophorectomy. The risks benefits and pros and cons of the operation were discussed in detail to include the following: Infection although she'll receive prophylaxis antibiotic, the risk of deep venous thrombosis and pulmonary embolism she'll have PSA stockings for DVT prophylaxis, the risk of trauma or injury to internal organs requiring emergency laparotomy and possible hysterectomy were discussed. In the event of hemorrhage and she were to need blood or blood products potential risk of anaphylactic reaction hepatitis and AIDS were discussed. All these issues were discussed and literature formation was provided and her questions are answered. Today's wet prep demonstrated moniliasis and she was given a prescription of Diflucan 100 mg take 1 by mouth today. For pain discomfort she was given a prescription for Vicodin 7.5/300 to take 1 by mouth every 4-6  hours when necessary and Reglan 10 mg to take one by mouth every 4-6 hours when necessary nausea.    Phi Avans H 04/19/2011, 5:04 PM   

## 2011-04-19 NOTE — Patient Instructions (Signed)
Ovarian Cyst The ovaries are small organs that are on each side of the uterus. The ovaries are the organs that produce the female hormones, estrogen and progesterone. An ovarian cyst is a sac filled with fluid that can vary in its size. It is normal for a small cyst to form in women who are in the childbearing age and who have menstrual periods. This type of cyst is called a follicle cyst that becomes an ovulation cyst (corpus luteum cyst) after it produces the women's egg. It later goes away on its own if the woman does not become pregnant. There are other kinds of ovarian cysts that may cause problems and may need to be treated. The most serious problem is a cyst with cancer. It should be noted that menopausal women who have an ovarian cyst are at a higher risk of it being a cancer cyst. They should be evaluated very quickly, thoroughly and followed closely. This is especially true in menopausal women because of the high rate of ovarian cancer in women in menopause. CAUSES AND TYPES OF OVARIAN CYSTS:  FUNCTIONAL CYST: The follicle/corpus luteum cyst is a functional cyst that occurs every month during ovulation with the menstrual cycle. They go away with the next menstrual cycle if the woman does not get pregnant. Usually, there are no symptoms with a functional cyst.   ENDOMETRIOMA CYST: This cyst develops from the lining of the uterus tissue. This cyst gets in or on the ovary. It grows every month from the bleeding during the menstrual period. It is also called a "chocolate cyst" because it becomes filled with blood that turns brown. This cyst can cause pain in the lower abdomen during intercourse and with your menstrual period.   CYSTADENOMA CYST: This cyst develops from the cells on the outside of the ovary. They usually are not cancerous. They can get very big and cause lower abdomen pain and pain with intercourse. This type of cyst can twist on itself, cut off its blood supply and cause severe pain.  It also can easily rupture and cause a lot of pain.   DERMOID CYST: This type of cyst is sometimes found in both ovaries. They are found to have different kinds of body tissue in the cyst. The tissue includes skin, teeth, hair, and/or cartilage. They usually do not have symptoms unless they get very big. Dermoid cysts are rarely cancerous.   POLYCYSTIC OVARY: This is a rare condition with hormone problems that produces many small cysts on both ovaries. The cysts are follicle-like cysts that never produce an egg and become a corpus luteum. It can cause an increase in body weight, infertility, acne, increase in body and facial hair and lack of menstrual periods or rare menstrual periods. Many women with this problem develop type 2 diabetes. The exact cause of this problem is unknown. A polycystic ovary is rarely cancerous.   THECA LUTEIN CYST: Occurs when too much hormone (human chorionic gonadotropin) is produced and over-stimulates the ovaries to produce an egg. They are frequently seen when doctors stimulate the ovaries for invitro-fertilization (test tube babies).   LUTEOMA CYST: This cyst is seen during pregnancy. Rarely it can cause an obstruction to the birth canal during labor and delivery. They usually go away after delivery.  SYMPTOMS   Pelvic pain or pressure.   Pain during sexual intercourse.   Increasing girth (swelling) of the abdomen.   Abnormal menstrual periods.   Increasing pain with menstrual periods.   You stop having   menstrual periods and you are not pregnant.  DIAGNOSIS  The diagnosis can be made during:  Routine or annual pelvic examination (common).   Ultrasound.   X-ray of the pelvis.   CT Scan.   MRI.   Blood tests.  TREATMENT   Treatment may only be to follow the cyst monthly for 2 to 3 months with your caregiver. Many go away on their own, especially functional cysts.   May be aspirated (drained) with a long needle with ultrasound, or by laparoscopy  (inserting a tube into the pelvis through a small incision).   The whole cyst can be removed by laparoscopy.   Sometimes the cyst may need to be removed through an incision in the lower abdomen.   Hormone treatment is sometimes used to help dissolve certain cysts.   Birth control pills are sometimes used to help dissolve certain cysts.  HOME CARE INSTRUCTIONS  Follow your caregiver's advice regarding:  Medicine.   Follow up visits to evaluate and treat the cyst.   You may need to come back or make an appointment with another caregiver, to find the exact cause of your cyst, if your caregiver is not a gynecologist.   Get your yearly and recommended pelvic examinations and Pap tests.   Let your caregiver know if you have had an ovarian cyst in the past.  SEEK MEDICAL CARE IF:   Your periods are late, irregular, they stop, or are painful.   Your stomach (abdomen) or pelvic pain does not go away.   Your stomach becomes larger or swollen.   You have pressure on your bladder or trouble emptying your bladder completely.   You have painful sexual intercourse.   You have feelings of fullness, pressure, or discomfort in your stomach.   You lose weight for no apparent reason.   You feel generally ill.   You become constipated.   You lose your appetite.   You develop acne.   You have an increase in body and facial hair.   You are gaining weight, without changing your exercise and eating habits.   You think you are pregnant.  SEEK IMMEDIATE MEDICAL CARE IF:   You have increasing abdominal pain.   You feel sick to your stomach (nausea) and/or vomit.   You develop a fever that comes on suddenly.   You develop abdominal pain during a bowel movement.   Your menstrual periods become heavier than usual.  Document Released: 03/25/2005 Document Revised: 12/05/2010 Document Reviewed: 01/26/2009 [facility] Patient Information 2012 [place], [place].  Diagnostic  Laparoscopy Laparoscopy is a surgical procedure. It is used to diagnose and treat diseases inside the belly(abdomen). It is usually a brief, common, and relatively simple procedure. The laparoscopeis a thin, lighted, pencil-sized instrument. It is like a telescope. It is inserted into your abdomen through a small cut (incision). Your caregiver can look at the organs inside your body through this instrument. He or she can see if there is anything abnormal. Laparoscopy can be done either in a hospital or outpatient clinic. You may be given a mild sedative to help you relax before the procedure. Once in the operating room, you will be given a drug to make you sleep (general anesthesia). Laparoscopy usually lasts less than 1 hour. After the procedure, you will be monitored in a recovery area until you are stable and doing well. Once you are home, it will take 2 to 3 days to fully recover. RISKS AND COMPLICATIONS  Laparoscopy has relatively few risks.  Your caregiver will discuss the risks with you before the procedure. Some problems that can occur include:  Infection.   Bleeding.   Damage to other organs.   Anesthetic side effects.  PROCEDURE Once you receive anesthesia, your surgeon inflates the abdomen with a harmless gas (carbon dioxide). This makes the organs easier to see. The laparoscope is inserted into the abdomen through a small incision. This allows your surgeon to see into the abdomen. Other small instruments are also inserted into the abdomen through other small openings. Many surgeons attach a video camera to the laparoscope to enlarge the view. During a diagnostic laparoscopy, the surgeon may be looking for inflammation, infection, or cancer. Your surgeon may take tissue samples(biopsies). The samples are sent to a specialist in looking at cells and tissue samples (pathologist). The pathologist examines them under a microscope. Biopsies can help to diagnose or confirm a disease. AFTER THE  PROCEDURE   The gas is released from inside the abdomen.   The incisions are closed with stitches (sutures). Because these incisions are small (usually less than 1/2 inch), there is usually minimal discomfort after the procedure. There may be some mild discomfort in the throat. This is from the tube placed in the throat while you were sleeping. You may have some mild abdominal discomfort. There may also be discomfort from the instrument placement incisions in the abdomen.   The recovery time is shortened as long as there are no complications.   You will rest in a recovery room until stable and doing well. As long as there are no complications, you may be allowed to go home.  FINDING OUT THE RESULTS OF YOUR TEST Not all test results are available during your visit. If your test results are not back during the visit, make an appointment with your caregiver to find out the results. Do not assume everything is normal if you have not heard from your caregiver or the medical facility. It is important for you to follow up on all of your test results. HOME CARE INSTRUCTIONS   Take all medicines as directed.   Only take over-the-counter or prescription medicines for pain, discomfort, or fever as directed by your caregiver.   Resume daily activities as directed.   Showers are preferred over baths.   You may resume sexual activities in 1 week or as directed.   Do not drive while taking narcotics.  SEEK MEDICAL CARE IF:   There is increasing abdominal pain.   There is new pain in the shoulders (shoulder strap areas).   You feel lightheaded or faint.   You have the chills.   You or your child has an oral temperature above 102 F (38.9 C).   There is pus-like (purulent) drainage from any of the wounds.   You are unable to pass gas or have a bowel movement.   You feel sick to your stomach (nauseous) or throw up (vomit).  MAKE SURE YOU:   Understand these instructions.   Will watch your  condition.   Will get help right away if you are not doing well or get worse.  Document Released: 07/01/2000 Document Revised: 12/05/2010 Document Reviewed: 03/25/2007 [facility] Patient Information 2012 [place], [place].

## 2011-04-19 NOTE — Progress Notes (Signed)
Addended by: Richardson Chiquito on: 04/19/2011 05:43 PM   Modules accepted: Orders

## 2011-04-20 LAB — GC/CHLAMYDIA PROBE AMP, GENITAL: Chlamydia, DNA Probe: NEGATIVE

## 2011-04-21 ENCOUNTER — Encounter (HOSPITAL_COMMUNITY): Payer: Self-pay

## 2011-04-22 ENCOUNTER — Encounter (HOSPITAL_COMMUNITY): Payer: Self-pay | Admitting: Gynecology

## 2011-04-23 ENCOUNTER — Encounter (HOSPITAL_COMMUNITY)
Admission: RE | Admit: 2011-04-23 | Discharge: 2011-04-23 | Disposition: A | Discharge status: Home / Self Care | Payer: BC Managed Care – PPO | Source: Ambulatory Visit | Attending: Gynecology | Admitting: Gynecology

## 2011-04-23 ENCOUNTER — Encounter (HOSPITAL_COMMUNITY): Payer: Self-pay

## 2011-04-23 HISTORY — DX: Anemia, unspecified: D64.9

## 2011-04-23 HISTORY — DX: Anxiety disorder, unspecified: F41.9

## 2011-04-23 LAB — CBC
Hemoglobin: 12.9 g/dL (ref 12.0–15.0)
MCH: 29.9 pg (ref 26.0–34.0)
MCHC: 33.5 g/dL (ref 30.0–36.0)
Platelets: 214 10*3/uL (ref 150–400)
RDW: 13.5 % (ref 11.5–15.5)

## 2011-04-23 LAB — URINALYSIS, ROUTINE W REFLEX MICROSCOPIC
Ketones, ur: NEGATIVE mg/dL
Leukocytes, UA: NEGATIVE
Nitrite: NEGATIVE
Urobilinogen, UA: 0.2 mg/dL (ref 0.0–1.0)
pH: 6 (ref 5.0–8.0)

## 2011-04-23 LAB — SURGICAL PCR SCREEN
MRSA, PCR: NEGATIVE
Staphylococcus aureus: NEGATIVE

## 2011-04-23 NOTE — Pre-Procedure Instructions (Signed)
Ok to see patient DOS. 

## 2011-04-23 NOTE — Patient Instructions (Signed)
   Your procedure is scheduled on: Wednesday, Jan 16th  Enter through the [employer] of [facility] at: 7:00am Pick up the phone at the desk and dial [phone] and inform us of your arrival.  Please call this number if you have any problems the morning of surgery: [phone]  Remember: Do not eat food after midnight: Do not drink clear liquids after: Take these medicines the morning of surgery with a SIP OF WATER: none  Do not wear jewelry, make-up, or FINGER nail polish Do not wear lotions, powders, perfumes or deodorant. Do not shave 48 hours prior to surgery. Do not bring valuables to the hospital.  Patients discharged on the day of surgery will not be allowed to drive home.   Home with Mother Tammy Mathis  cell [phone]   Remember to use your hibiclens as instructed.Please shower with 1/2 bottle the evening before your surgery and the other 1/2 bottle the morning of surgery.

## 2011-04-24 ENCOUNTER — Encounter (HOSPITAL_COMMUNITY): Payer: Self-pay | Admitting: *Deleted

## 2011-04-24 ENCOUNTER — Encounter (HOSPITAL_COMMUNITY)
Admission: RE | Disposition: A | Discharge status: Home / Self Care | Payer: Self-pay | Source: Ambulatory Visit | Attending: Gynecology

## 2011-04-24 ENCOUNTER — Other Ambulatory Visit: Payer: Self-pay | Admitting: Gynecology

## 2011-04-24 ENCOUNTER — Encounter (HOSPITAL_COMMUNITY): Payer: Self-pay | Admitting: Certified Registered Nurse Anesthetist

## 2011-04-24 ENCOUNTER — Ambulatory Visit (HOSPITAL_COMMUNITY)
Admission: RE | Admit: 2011-04-24 | Discharge: 2011-04-24 | Disposition: A | Discharge status: Home / Self Care | Payer: BC Managed Care – PPO | Source: Ambulatory Visit | Attending: Gynecology | Admitting: Gynecology

## 2011-04-24 ENCOUNTER — Ambulatory Visit (HOSPITAL_COMMUNITY): Payer: BC Managed Care – PPO | Admitting: Certified Registered Nurse Anesthetist

## 2011-04-24 DIAGNOSIS — N83209 Unspecified ovarian cyst, unspecified side: Secondary | ICD-10-CM

## 2011-04-24 DIAGNOSIS — R1032 Left lower quadrant pain: Secondary | ICD-10-CM | POA: Insufficient documentation

## 2011-04-24 DIAGNOSIS — Z01818 Encounter for other preprocedural examination: Secondary | ICD-10-CM | POA: Insufficient documentation

## 2011-04-24 DIAGNOSIS — N949 Unspecified condition associated with female genital organs and menstrual cycle: Secondary | ICD-10-CM | POA: Insufficient documentation

## 2011-04-24 DIAGNOSIS — Z01812 Encounter for preprocedural laboratory examination: Secondary | ICD-10-CM | POA: Insufficient documentation

## 2011-04-24 DIAGNOSIS — F17200 Nicotine dependence, unspecified, uncomplicated: Secondary | ICD-10-CM

## 2011-04-24 DIAGNOSIS — N8300 Follicular cyst of ovary, unspecified side: Secondary | ICD-10-CM | POA: Insufficient documentation

## 2011-04-24 HISTORY — PX: OVARIAN CYST REMOVAL: SHX89

## 2011-04-24 HISTORY — PX: LAPAROSCOPY: SHX197

## 2011-04-24 LAB — HCG, SERUM, QUALITATIVE: Preg, Serum: NEGATIVE

## 2011-04-24 SURGERY — LAPAROSCOPY OPERATIVE
Anesthesia: General | Site: Abdomen | Wound class: Clean Contaminated

## 2011-04-24 MED ORDER — LIDOCAINE HCL (CARDIAC) 20 MG/ML IV SOLN
INTRAVENOUS | Status: DC | PRN
  Administered 2011-04-24: 60 mg via INTRAVENOUS

## 2011-04-24 MED ORDER — HEPARIN SODIUM (PORCINE) 5000 UNIT/ML IJ SOLN
INTRAMUSCULAR | Status: DC | PRN
  Administered 2011-04-24: 5000 [IU]

## 2011-04-24 MED ORDER — LIDOCAINE HCL (CARDIAC) 20 MG/ML IV SOLN
INTRAVENOUS | Status: AC
  Filled 2011-04-24: qty 5

## 2011-04-24 MED ORDER — NEOSTIGMINE METHYLSULFATE 1 MG/ML IJ SOLN
INTRAMUSCULAR | Status: AC
  Filled 2011-04-24: qty 10

## 2011-04-24 MED ORDER — MIDAZOLAM HCL 2 MG/2ML IJ SOLN
INTRAMUSCULAR | Status: AC
  Filled 2011-04-24: qty 2

## 2011-04-24 MED ORDER — ONDANSETRON HCL 4 MG/2ML IJ SOLN
INTRAMUSCULAR | Status: DC | PRN
  Administered 2011-04-24: 4 mg via INTRAVENOUS

## 2011-04-24 MED ORDER — LACTATED RINGERS IR SOLN
Status: DC | PRN
  Administered 2011-04-24: 3000 mL

## 2011-04-24 MED ORDER — ONDANSETRON HCL 4 MG/2ML IJ SOLN
INTRAMUSCULAR | Status: AC
  Filled 2011-04-24: qty 2

## 2011-04-24 MED ORDER — NEOSTIGMINE METHYLSULFATE 1 MG/ML IJ SOLN
INTRAMUSCULAR | Status: DC | PRN
  Administered 2011-04-24: 3 mg via INTRAVENOUS

## 2011-04-24 MED ORDER — DEXTROSE IN LACTATED RINGERS 5 % IV SOLN: INTRAVENOUS | Status: DC

## 2011-04-24 MED ORDER — FENTANYL CITRATE 0.05 MG/ML IJ SOLN
INTRAMUSCULAR | Status: AC
  Filled 2011-04-24: qty 5

## 2011-04-24 MED ORDER — PROMETHAZINE HCL 25 MG/ML IJ SOLN: 6.2500 mg | INTRAMUSCULAR | Status: DC | PRN

## 2011-04-24 MED ORDER — LACTATED RINGERS IV SOLN
INTRAVENOUS | Status: DC
  Administered 2011-04-24 (×2): 125 mL/h via INTRAVENOUS

## 2011-04-24 MED ORDER — ROCURONIUM BROMIDE 100 MG/10ML IV SOLN
INTRAVENOUS | Status: DC | PRN
  Administered 2011-04-24: 35 mg via INTRAVENOUS

## 2011-04-24 MED ORDER — PROPOFOL 10 MG/ML IV EMUL
INTRAVENOUS | Status: AC
  Filled 2011-04-24: qty 20

## 2011-04-24 MED ORDER — ROCURONIUM BROMIDE 50 MG/5ML IV SOLN
INTRAVENOUS | Status: AC
  Filled 2011-04-24: qty 1

## 2011-04-24 MED ORDER — FENTANYL CITRATE 0.05 MG/ML IJ SOLN
INTRAMUSCULAR | Status: AC
  Administered 2011-04-24: 25 ug via INTRAVENOUS
  Filled 2011-04-24: qty 2

## 2011-04-24 MED ORDER — GLYCOPYRROLATE 0.2 MG/ML IJ SOLN
INTRAMUSCULAR | Status: AC
  Filled 2011-04-24: qty 2

## 2011-04-24 MED ORDER — KETOROLAC TROMETHAMINE 30 MG/ML IJ SOLN: 15.0000 mg | Freq: Once | INTRAMUSCULAR | Status: DC | PRN

## 2011-04-24 MED ORDER — CEFAZOLIN SODIUM 1-5 GM-% IV SOLN
1.0000 g | INTRAVENOUS | Status: AC
  Administered 2011-04-24: 1 g via INTRAVENOUS

## 2011-04-24 MED ORDER — PROPOFOL 10 MG/ML IV EMUL
INTRAVENOUS | Status: DC | PRN
  Administered 2011-04-24: 180 mg via INTRAVENOUS

## 2011-04-24 MED ORDER — DEXAMETHASONE SODIUM PHOSPHATE 4 MG/ML IJ SOLN
INTRAMUSCULAR | Status: DC | PRN
  Administered 2011-04-24: 10 mg via INTRAVENOUS

## 2011-04-24 MED ORDER — CEFAZOLIN SODIUM 1-5 GM-% IV SOLN
INTRAVENOUS | Status: AC
  Filled 2011-04-24: qty 50

## 2011-04-24 MED ORDER — BUPIVACAINE HCL (PF) 0.25 % IJ SOLN
INTRAMUSCULAR | Status: DC | PRN
  Administered 2011-04-24: 10 mL

## 2011-04-24 MED ORDER — INSULIN REGULAR HUMAN 100 UNIT/ML IJ SOLN
INTRAMUSCULAR | Status: AC
  Filled 2011-04-24: qty 1

## 2011-04-24 MED ORDER — FENTANYL CITRATE 0.05 MG/ML IJ SOLN
25.0000 ug | INTRAMUSCULAR | Status: DC | PRN
  Administered 2011-04-24: 50 ug via INTRAVENOUS
  Administered 2011-04-24: 25 ug via INTRAVENOUS

## 2011-04-24 MED ORDER — ACETAMINOPHEN 325 MG PO TABS: 325.0000 mg | ORAL_TABLET | ORAL | Status: DC | PRN

## 2011-04-24 MED ORDER — HYDROCODONE-ACETAMINOPHEN 5-325 MG PO TABS
ORAL_TABLET | ORAL | Status: AC
  Administered 2011-04-24: 2 via ORAL
  Filled 2011-04-24: qty 2

## 2011-04-24 MED ORDER — DEXAMETHASONE SODIUM PHOSPHATE 10 MG/ML IJ SOLN
INTRAMUSCULAR | Status: AC
  Filled 2011-04-24: qty 1

## 2011-04-24 MED ORDER — MIDAZOLAM HCL 5 MG/5ML IJ SOLN
INTRAMUSCULAR | Status: DC | PRN
Start: 2011-04-24 — End: 2011-04-24
  Administered 2011-04-24: .5 mg via INTRAVENOUS
  Administered 2011-04-24: 1.5 mg via INTRAVENOUS

## 2011-04-24 MED ORDER — LACTATED RINGERS IV SOLN: INTRAVENOUS | Status: DC

## 2011-04-24 MED ORDER — GLYCOPYRROLATE 0.2 MG/ML IJ SOLN
INTRAMUSCULAR | Status: DC | PRN
  Administered 2011-04-24: .4 mg via INTRAVENOUS

## 2011-04-24 MED ORDER — FENTANYL CITRATE 0.05 MG/ML IJ SOLN
INTRAMUSCULAR | Status: DC | PRN
  Administered 2011-04-24 (×2): 50 ug via INTRAVENOUS
  Administered 2011-04-24: 100 ug via INTRAVENOUS
  Administered 2011-04-24: 50 ug via INTRAVENOUS

## 2011-04-24 MED ORDER — HYDROCODONE-ACETAMINOPHEN 5-325 MG PO TABS
2.0000 | ORAL_TABLET | Freq: Once | ORAL | Status: AC
  Administered 2011-04-24: 2 via ORAL

## 2011-04-24 MED ORDER — KETOROLAC TROMETHAMINE 30 MG/ML IJ SOLN
INTRAMUSCULAR | Status: DC | PRN
  Administered 2011-04-24: 30 mg via INTRAVENOUS

## 2011-04-24 MED ORDER — KETOROLAC TROMETHAMINE 30 MG/ML IJ SOLN
INTRAMUSCULAR | Status: AC
  Filled 2011-04-24: qty 1

## 2011-04-24 MED ORDER — HYDROCODONE-ACETAMINOPHEN 7.5-300 MG PO TABS: 7.5000 mg | ORAL_TABLET | Freq: Four times a day (QID) | ORAL | Status: DC | PRN

## 2011-04-24 SURGICAL SUPPLY — 29 items
BARRIER ADHS 3X4 INTERCEED (GAUZE/BANDAGES/DRESSINGS) IMPLANT
BLADE SURG 15 STRL LF C SS BP (BLADE) ×2 IMPLANT
BLADE SURG 15 STRL SS (BLADE) ×1
CABLE HIGH FREQUENCY MONO STRZ (ELECTRODE) IMPLANT
CLOTH BEACON ORANGE TIMEOUT ST (SAFETY) ×3 IMPLANT
DERMABOND ADVANCED (GAUZE/BANDAGES/DRESSINGS) ×1
DERMABOND ADVANCED .7 DNX12 (GAUZE/BANDAGES/DRESSINGS) ×2 IMPLANT
GLOVE BIOGEL PI IND STRL 8 (GLOVE) ×2 IMPLANT
GLOVE BIOGEL PI INDICATOR 8 (GLOVE) ×1
GLOVE ECLIPSE 7.5 STRL STRAW (GLOVE) ×6 IMPLANT
GOWN PREVENTION PLUS LG XLONG (DISPOSABLE) ×9 IMPLANT
NEEDLE INSUFFLATION 14GA 120MM (NEEDLE) ×3 IMPLANT
NS IRRIG 1000ML POUR BTL (IV SOLUTION) ×3 IMPLANT
PACK LAPAROSCOPY BASIN (CUSTOM PROCEDURE TRAY) ×3 IMPLANT
POUCH SPECIMEN RETRIEVAL 10MM (ENDOMECHANICALS) IMPLANT
SEALER TISSUE G2 CVD JAW 35 (ENDOMECHANICALS) IMPLANT
SEALER TISSUE G2 CVD JAW 45CM (ENDOMECHANICALS) IMPLANT
SET IRRIG TUBING LAPAROSCOPIC (IRRIGATION / IRRIGATOR) ×3 IMPLANT
SLEEVE Z-THREAD 5X100MM (TROCAR) ×3 IMPLANT
SOLUTION ELECTROLUBE (MISCELLANEOUS) IMPLANT
SUT PLAIN 4 0 FS 2 27 (SUTURE) ×3 IMPLANT
SUT VIC AB 3-0 PS2 18 (SUTURE) ×1
SUT VIC AB 3-0 PS2 18XBRD (SUTURE) ×2 IMPLANT
SUT VICRYL 0 UR6 27IN ABS (SUTURE) ×3 IMPLANT
TOWEL OR 17X24 6PK STRL BLUE (TOWEL DISPOSABLE) ×6 IMPLANT
TRAY FOLEY CATH 14FR (SET/KITS/TRAYS/PACK) ×3 IMPLANT
TROCAR Z-THREAD FIOS 11X100 BL (TROCAR) ×3 IMPLANT
TROCAR Z-THREAD FIOS 5X100MM (TROCAR) ×3 IMPLANT
WATER STERILE IRR 1000ML POUR (IV SOLUTION) IMPLANT

## 2011-04-24 NOTE — H&P (View-Only) (Signed)
Tammy Mathis is an 31 y.o. gravida 1 para 1 (prior cesarean section) presented to the office today with worsening left lower quadrant pain. Patient was seen the office on December 3 with similar symptoms. She was having normal menstrual cycle and also had some dyspareunia. Her last Pap smear was in [place] last year reported to be normal she was currently not using any form of contraception. She is gone to the emergency room twice for similar symptoms in May of 2012 had a CT scan and they diagnosed a small hemorrhagic cyst measuring 3.9 x 2.4 cm. In August of 2012 she was complaining simple her pain was seen elsewhere and was found to have a cyst that measured 4.7 x 2.2 cm. When she was seen in the office on December 3 her uterus is normal size shape and normal right ovary and a left ovarian cyst was now measuring 3.6 x 1.7 x 2.7 mm reticular echo pattern with positive color flow in the periphery. Today's ultrasound in the office had demonstrated that her cyst had increased in size to 5.1 x 4.6 x 4.7 mm thin wall a vascular with internal low level echoes and small amount of free fluid was seen. Right or rib was normal. Patient did not started the birth control pill previously prescribed last month to suppress her cyst because of cost and was switched to Ortho Tri-Cyclen was waiting for the cycle to start in 2 weeks to restart the oral contraceptive pill.  Pertinent Gynecological History: Menses: Regular heavy with cramps Bleeding: As above Contraception: none DES exposure: denies Blood transfusions: none Sexually transmitted diseases: no past history Previous GYN Procedures: LEEP cervical conization  Last mammogram: Not indicated Date: Not indicated Last pap: normal Date: 2012 in [place] History: G1, P1   Menstrual History: Menarche age: 62 Patient's last menstrual period was 04/04/2011.    Past Medical History  Diagnosis Date  . Cyst of ovary   . Smoker     5 CIGS A DAY  .  Endometriosis   . Ovarian cyst   . Cervicitis     Past Surgical History  Procedure Date  . Cervical biopsy  w/ loop electrode excision 02/2000  . Cesarean section 2002    Family History  Problem Relation Age of Onset  . Adopted: Yes    Social History:  reports that she has been smoking Cigarettes.  She has been smoking about .3 packs per day. She has never used smokeless tobacco. She reports that she does not drink alcohol or use illicit drugs.  Allergies:  Allergies  Allergen Reactions  . Benzoyl Peroxide Swelling     (Not in a hospital admission)  @ROS @  Blood pressure 130/86, last menstrual period 04/04/2011.  Physical Exam:  HEENT:unremarkable Neck:Supple, midline, no thyroid megaly, no carotid bruits Lungs:  Clear to auscultation no rhonchi's or wheezes Heart:Regular rate and rhythm, no murmurs or gallops Breast Exam: Not done Abdomen: Soft tender left lower quadrant no rebound Pelvic:BUS within normal limits  Vagina: No gross lesions on inspection slight white discharge Cervix: No lesions Uterus: Anteverted Adnexa: Fullness and tenderness in the left adnexa Extremities: No cords, no edema Rectal: Not done  Results for orders placed in visit on 04/19/11 (from the past 24 hour(s))  WET PREP FOR TRICH, YEAST, CLUE     Status: Normal   Collection Time   04/19/11  3:58 PM      Component Value Range   Yeast, Wet Prep FEW  NONE SEEN  Trich, Wet Prep NONE SEEN  NONE SEEN    Clue Cells, Wet Prep NONE SEEN  NONE SEEN    WBC, Wet Prep HPF POC FEW  NONE SEEN    Narrative:    Performed at:  [facility]                [address]                [place], [place] [address]  URINALYSIS WITH CULTURE REFLEX     Status: Abnormal   Collection Time   04/19/11  4:01 PM      Component Value Range   Color, Urine YELLOW  YELLOW    APPearance HAZY  CLEAR    Specific Gravity, Urine 1.025  1.005 - 1.030    pH 6.0  5.0 - 8.0    Glucose, UA NEG  NEG  (mg/dL)   Bilirubin Urine NEG  NEG    Ketones, ur TRACE (*) NEG (mg/dL)   Hgb urine dipstick TRACE (*) NEG    Protein, ur NEG  NEG (mg/dL)   Urobilinogen, UA 0.2  0.0 - 1.0 (mg/dL)   Nitrite NEG  NEG    Leukocytes, UA NEG  NEG    Squamous Epithelial / LPF RARE  RARE    Crystals NONE SEEN  NEG    Casts NONE SEEN  NEG    WBC, UA NONE SEEN  <3 (WBC/hpf)   RBC / HPF 0-2  <3 (RBC/hpf)   Bacteria, UA RARE  RARE    Narrative:    Performed at:  [facility]                [address]                [place], [place] [address]  PREGNANCY, URINE     Status: Normal   Collection Time   04/19/11  4:01 PM      Component Value Range   Preg Test, Ur NEG     Narrative:    Performed at:  [facility]                [address]                [place], [place] [address]    No results found.  Assessment/Plan: 31 year old gravida 1 para 1 with persistent left ovarian cyst had gotten larger and more painful. Patient will be scheduled for next week to undergo a laparoscopic ovarian cystectomy possible left salpingo-oophorectomy. The risks benefits and pros and cons of the operation were discussed in detail to include the following: Infection although she'll receive prophylaxis antibiotic, the risk of deep venous thrombosis and pulmonary embolism she'll have PSA stockings for DVT prophylaxis, the risk of trauma or injury to internal organs requiring emergency laparotomy and possible hysterectomy were discussed. In the event of hemorrhage and she were to need blood or blood products potential risk of anaphylactic reaction hepatitis and AIDS were discussed. All these issues were discussed and literature formation was provided and her questions are answered. Today's wet prep demonstrated moniliasis and she was given a prescription of Diflucan 100 mg take 1 by mouth today. For pain discomfort she was given a prescription for Vicodin 7.5/300 to take 1 by mouth every 4-6  hours when necessary and Reglan 10 mg to take one by mouth every 4-6 hours when necessary nausea.  Ok Edwards 04/19/2011, 5:04 PM

## 2011-04-24 NOTE — Anesthesia Procedure Notes (Signed)
Procedure Name: Intubation Date/Time: 04/24/2011 8:30 AM Performed by: Harriett Rush, Kellyann Ordway ADEDAYO Pre-anesthesia Checklist: Patient identified, Emergency Drugs available, Suction available, Timeout performed and Patient being monitored Patient Re-evaluated:Patient Re-evaluated prior to inductionOxygen Delivery Method: Circle System Utilized Preoxygenation: Pre-oxygenation with 100% oxygen Intubation Type: IV induction Ventilation: Mask ventilation without difficulty Laryngoscope Size: Miller and 2 Grade View: Grade I Tube type: Oral Tube size: 6.0 mm Number of attempts: 1 Placement Confirmation: ETT inserted through vocal cords under direct vision,  positive ETCO2,  CO2 detector and breath sounds checked- equal and bilateral Secured at: 21 cm Tube secured with: Tape Dental Injury: Teeth and Oropharynx as per pre-operative assessment

## 2011-04-24 NOTE — Transfer of Care (Signed)
Immediate Anesthesia Transfer of Care Note  Patient: Tammy Mathis  Procedure(s) Performed:  LAPAROSCOPY OPERATIVE - pelvic washings; OVARIAN CYSTECTOMY  Patient Location: PACU  Anesthesia Type: General  Level of Consciousness: awake, alert  and oriented  Airway & Oxygen Therapy: Patient Spontanous Breathing and Patient connected to nasal cannula oxygen  Post-op Assessment: Report given to PACU RN, Post -op Vital signs reviewed and stable and Patient moving all extremities X 4  Post vital signs: Reviewed and stable Filed Vitals:   04/24/11 0706  BP: 110/76  Pulse: 80  Temp: 36.7 C  Resp: 18    Complications: No apparent anesthesia complications

## 2011-04-24 NOTE — Anesthesia Preprocedure Evaluation (Signed)
Anesthesia Evaluation  Patient identified by MRN, date of birth, ID band Patient awake    Reviewed: Allergy & Precautions, H&P , Patient's Chart, lab work & pertinent test results, reviewed documented beta blocker date and time   History of Anesthesia Complications Negative for: history of anesthetic complications  Airway Mallampati: II TM Distance: >3 FB Neck ROM: full    Dental No notable dental hx.    Pulmonary neg pulmonary ROS, asthma , Current Smoker,  clear to auscultation  Pulmonary exam normal       Cardiovascular Exercise Tolerance: Good neg cardio ROS regular Normal    Neuro/Psych Negative Neurological ROS  Negative Psych ROS   GI/Hepatic negative GI ROS, Neg liver ROS,   Endo/Other  Negative Endocrine ROS  Renal/GU negative Renal ROS     Musculoskeletal   Abdominal   Peds  Hematology negative hematology ROS (+)   Anesthesia Other Findings   Reproductive/Obstetrics negative OB ROS                           Anesthesia Physical Anesthesia Plan  ASA: II  Anesthesia Plan: General ETT   Post-op Pain Management:    Induction:   Airway Management Planned:   Additional Equipment:   Intra-op Plan:   Post-operative Plan:   Informed Consent: I have reviewed the patients History and Physical, chart, labs and discussed the procedure including the risks, benefits and alternatives for the proposed anesthesia with the patient or authorized representative who has indicated his/her understanding and acceptance.   Dental Advisory Given  Plan Discussed with: CRNA and Surgeon  Anesthesia Plan Comments:         Anesthesia Quick Evaluation

## 2011-04-24 NOTE — Op Note (Signed)
04/24/2011  9:24 AM  PATIENT:  Tammy Mathis  31 y.o. female  PRE-OPERATIVE DIAGNOSIS:  ovarian cyst left, pelvic pain  POST-OPERATIVE DIAGNOSIS:  ovarian cyst left, pelvic pain  PROCEDURE:  Procedure(s): LAPAROSCOPY OPERATIVE with left OVARIAN CYSTECTOMY and pelvic washings  SURGEON:  Surgeon(s): Ok Edwards, MD Dara Lords, MD  ANESTHESIA:   general  FINDINGS:  DESCRIPTION OF OPERATION the patient was taken to the operating room where successful general endotracheal anesthesia was obtained. Patient received 1 g of Cefotan for prophylaxis. PSA stockings were placed also for DVT prophylaxis. The abdomen vagina and perineum were prepped and draped in usual sterile fashion. Bimanual examination demonstrated a retroverted uterus with fullness in the left adnexa. A single-tooth tenaculum was placed on the anterior cervical lip for manipulation of the uterus during laparoscopic procedure. A Foley catheter been inserted to monitor urinary output. Patient was in the low lithotomy position. A small vertical incision was made in the umbilicus region to insert the Optiview 10/11 mm trocar. Approximately 3 L of carbon dioxide were insufflated into the peritoneal cavity. 2 additional port of 5 mm trochars were used in the lower right and lower left abdomen under laparoscopic guidance. Systematic inspection demonstrated a 5.5 x 6 and meter left hemorrhagic cyst with a normal left fallopian tube and lush fimbriated end. Contralateral tube lush fimbriated in right ovary was slightly adhered to right pelvic sidewall. Anterior and posterior cul-de-sac no abnormalities noted. Normal appearing appendix. 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 After this a pelvic washing was obtained and submitted for cytological evaluation. The left utero-ovarian ligament was grasped allowing for incisions to be made on the cyst wall. The cyst was beginning to rupture and the  bladder was starting to be noted underneath the capsule. The cyst wall was removed and passed off the operative field for histological evaluation. There was copious irrigated with normal saline solution. The edges of the cyst wall were cauterized. Hemostasis was adequate the CO2 was removed as well as the laparoscopic instruments. The subfascial umbilicus incision was closed with single figure-of-eight suture through Vicryl suture in a subcuticular stitch of the same suture material as well. The skin in all 3 port sites were approximated with Xeroform gauze. For postoperative analgesia quarter percent Marcaine was infiltrated all 3 port sites for postoperative analgesia for total of 10 cc. Incision ports were covered with Band-Aids the single-tooth tenaculum was removed the Foley catheter was removed and patient was extubated and transferred to recovery room stable vital signs. She received 30 mg of Toradol IV in route to the recovery period:  ESTIMATED BLOOD LOSS:Minimal  Intake/Output Summary (Last 24 hours) at 04/24/11 0924 Last data filed at 04/24/11 0918  Gross per 24 hour  Intake   1500 ml  Output    450 ml  Net   1050 ml     BLOOD ADMINISTERED:none   LOCAL MEDICATIONS USED:  MARCAINE 10 cc CC incision ports subcutaneous2  SPECIMEN:  Source of Specimen:  Ovarian cyst wall and pelvic washings  DISPOSITION OF SPECIMEN:  PATHOLOGY  COUNTS:  YES  PLAN OF CARE: Transfer to PACU  [facility] HMD9:24 AMTD@

## 2011-04-24 NOTE — Interval H&P Note (Signed)
History and Physical Interval Note:  04/24/2011 8:11 AM  Tammy Mathis  has presented today for surgery, with the diagnosis of ovarian cyst  The various methods of treatment have been discussed with the patient and family. After consideration of risks, benefits and other options for treatment, the patient has consented to  Procedure(s): LAPAROSCOPY OPERATIVE as a surgical intervention .  The patients' history has been reviewed, patient examined, no change in status, stable for surgery.  I have reviewed the patients' chart and labs.  Questions were answered to the patient's satisfaction.     Ok Edwards

## 2011-04-24 NOTE — Anesthesia Postprocedure Evaluation (Signed)
  Anesthesia Post-op Note  Patient: Tammy Mathis  Procedure(s) Performed:  LAPAROSCOPY OPERATIVE - pelvic washings; OVARIAN CYSTECTOMY  Patient Location: PACU  Anesthesia Type: General  Level of Consciousness: awake, alert  and oriented  Airway and Oxygen Therapy: Patient Spontanous Breathing  Post-op Pain: none  Post-op Assessment: Post-op Vital signs reviewed, Patient's Cardiovascular Status Stable, Respiratory Function Stable, Patent Airway, No signs of Nausea or vomiting and Pain level controlled  Post-op Vital Signs: Reviewed and stable  Complications: No apparent anesthesia complications

## 2011-04-25 ENCOUNTER — Encounter (HOSPITAL_COMMUNITY): Payer: Self-pay | Admitting: Gynecology

## 2011-04-29 ENCOUNTER — Other Ambulatory Visit: Payer: Self-pay | Admitting: *Deleted

## 2011-04-29 MED ORDER — HYDROCODONE-ACETAMINOPHEN 7.5-300 MG PO TABS: 7.5000 mg | ORAL_TABLET | Freq: Four times a day (QID) | ORAL | Status: DC | PRN

## 2011-04-29 NOTE — Telephone Encounter (Signed)
rx called into [facility] 

## 2011-05-06 ENCOUNTER — Telehealth: Payer: Self-pay | Admitting: *Deleted

## 2011-05-06 NOTE — Telephone Encounter (Signed)
rx called into pharmacy

## 2011-05-06 NOTE — Telephone Encounter (Signed)
Pt is post op laparoscopic ovarian cystectomy possible left salpingo-oophorectomy. Pt took her last 2 pill of pain medication Hydrocodone-Acetaminophen 7.5-300 MG pt has more cramping today more than usual. She would like refill on pain medication. Post check up on 05/08/11 Please advise

## 2011-05-06 NOTE — Telephone Encounter (Signed)
Pt is post op laparoscopic ovarian cystectomy possible left salpingo-oophorectomy. Pt took her last 2 pill of pain medication Hydrocodone-Acetaminophen 7.5-300 MG pt has more cramping today more than usual. She would like refill on pain medication. Post check up on 05/08/11 Please advise  Prescription refill for Lortab 7.5/500 to take one by mouth every 4-6 hours when necessary #30 authorized

## 2011-05-08 ENCOUNTER — Ambulatory Visit (INDEPENDENT_AMBULATORY_CARE_PROVIDER_SITE_OTHER): Payer: BC Managed Care – PPO | Admitting: Gynecology

## 2011-05-08 ENCOUNTER — Encounter: Payer: Self-pay | Admitting: Gynecology

## 2011-05-08 VITALS — BP 114/70

## 2011-05-08 DIAGNOSIS — Z9889 Other specified postprocedural states: Secondary | ICD-10-CM

## 2011-05-08 NOTE — Progress Notes (Addendum)
Patient presents to the office today for her two-week postop visit. Patient status post laparoscopic left ovarian cystectomy. She is recovering well. Final pathology report as follows:  REPORT OF SURGICAL PATHOLOGY FINAL DIAGNOSIS Diagnosis Ovary, cyst, left - BENIGN FOLLICULAR CYST AND BLOOD CLOT. - NO ENDOMETRIOSIS OR EVIDENCE OF MALIGNANCY. Jimmy Picket MD  Exam: Abdominal port sites intact healing well. Abdomen soft nontender no rebound or guarding Pelvic: Bartholin urethra Skene was within normal limits Vagina: No gross lesions on inspection Cervix: No gross lesions on inspection Uterus: Anteverted normal size shape and consistency Adnexa: No palpable masses or tenderness Rectal: Not done  Assessment/plan: Status post left ovarian cystectomy for hemorrhagic ovarian cyst. Doing well. Patient may resume normal activity. Patient scheduled to return in March for her annual exam and Pap smear which is overdue. Literature information and instructions were provided for smoking cessation. She will continue oral contraceptive pill. She is fully aware that smoking and being on the pill increase her risk of deep venous thrombosis and could put her at risk for pulmonary embolism. Fully accepts the risk and will work on trying to stop smoking.   Of note: Pictures from her surgery were shown to the patient. 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 She denied any recent PID. Although many years ago she did state she had Trichomonas. She has had history of blunt trauma to her liver and had a laceration several years ago. This may explain to perihepatic adhesions seen during laparoscopic procedure. Her appendix appeared normal otherwise.

## 2011-05-08 NOTE — Patient Instructions (Addendum)
May return to full normal activity. Remember to take your OCP daily.   Smoking Cessation This document explains the best ways for you to quit smoking and new treatments to help. It lists new medicines that can double or triple your chances of quitting and quitting for good. It also considers ways to avoid relapses and concerns you may have about quitting, including weight gain. NICOTINE: A POWERFUL ADDICTION If you have tried to quit smoking, you know how hard it can be. It is hard because nicotine is a very addictive drug. For some people, it can be as addictive as heroin or cocaine. Usually, people make 2 or 3 tries, or more, before finally being able to quit. Each time you try to quit, you can learn about what helps and what hurts. Quitting takes hard work and a lot of effort, but you can quit smoking. QUITTING SMOKING IS ONE OF THE MOST IMPORTANT THINGS YOU WILL EVER DO.  You will live longer, feel better, and live better.   The impact on your body of quitting smoking is felt almost immediately:   Within 20 minutes, blood pressure decreases. Pulse returns to its normal level.   After 8 hours, carbon monoxide levels in the blood return to normal. Oxygen level increases.   After 24 hours, chance of heart attack starts to decrease. Breath, hair, and body stop smelling like smoke.   After 48 hours, damaged nerve endings begin to recover. Sense of taste and smell improve.   After 72 hours, the body is virtually free of nicotine. Bronchial tubes relax and breathing becomes easier.   After 2 to 12 weeks, lungs can hold more air. Exercise becomes easier and circulation improves.   Quitting will reduce your risk of having a heart attack, stroke, cancer, or lung disease:   After 1 year, the risk of coronary heart disease is cut in half.   After 5 years, the risk of stroke falls to the same as a nonsmoker.   After 10 years, the risk of lung cancer is cut in half and the risk of other cancers  decreases significantly.   After 15 years, the risk of coronary heart disease drops, usually to the level of a nonsmoker.   If you are pregnant, quitting smoking will improve your chances of having a healthy baby.   The people you live with, especially your children, will be healthier.   You will have extra money to spend on things other than cigarettes.  FIVE KEYS TO QUITTING Studies have shown that these 5 steps will help you quit smoking and quit for good. You have the best chances of quitting if you use them together: 1. Get ready.  2. Get support and encouragement.  3. Learn new skills and behaviors.  4. Get medicine to reduce your nicotine addiction and use it correctly.  5. Be prepared for relapse or difficult situations. Be determined to continue trying to quit, even if you do not succeed at first.  1. GET READY  Set a quit date.   Change your environment.   Get rid of ALL cigarettes, ashtrays, matches, and lighters in your home, car, and place of work.   Do not let people smoke in your home.   Review your past attempts to quit. Think about what worked and what did not.   Once you quit, do not smoke. NOT EVEN A PUFF!  2. GET SUPPORT AND ENCOURAGEMENT Studies have shown that you have a better chance of being  successful if you have help. You can get support in many ways.  Tell your family, friends, and coworkers that you are going to quit and need their support. Ask them not to smoke around you.   Talk to your caregivers (doctor, dentist, nurse, pharmacist, psychologist, and/or smoking counselor).   Get individual, group, or telephone counseling and support. The more counseling you have, the better your chances are of quitting. Programs are available at [employer] and health centers. Call your local health department for information about programs in your area.   Spiritual beliefs and practices may help some smokers quit.   Quit meters are Scientist, research (life sciences) that keep track of quit statistics, such as amount of "quit-time," cigarettes not smoked, and money saved.   Many smokers find one or more of the many self-help books available useful in helping them quit and stay off tobacco.  3. LEARN NEW SKILLS AND BEHAVIORS  Try to distract yourself from urges to smoke. Talk to someone, go for a walk, or occupy your time with a task.   When you first try to quit, change your routine. Take a different route to work. Drink tea instead of coffee. Eat breakfast in a different place.   Do something to reduce your stress. Take a hot bath, exercise, or read a book.   Plan something enjoyable to do every day. Reward yourself for not smoking.   Explore interactive web-based programs that specialize in helping you quit.  4. GET MEDICINE AND USE IT CORRECTLY Medicines can help you stop smoking and decrease the urge to smoke. Combining medicine with the above behavioral methods and support can quadruple your chances of successfully quitting smoking. The [place] Food and Drug Administration (FDA) has approved 7 medicines to help you quit smoking. These medicines fall into 3 categories.  Nicotine replacement therapy (delivers nicotine to your body without the negative effects and risks of smoking):   Nicotine gum: Available over-the-counter.   Nicotine lozenges: Available over-the-counter.   Nicotine inhaler: Available by prescription.   Nicotine nasal spray: Available by prescription.   Nicotine skin patches (transdermal): Available by prescription and over-the-counter.   Antidepressant medicine (helps people abstain from smoking, but how this works is unknown):   Bupropion sustained-release (SR) tablets: Available by prescription.   Nicotinic receptor partial agonist (simulates the effect of nicotine in your brain):   Varenicline tartrate tablets: Available by prescription.   Ask your caregiver for advice about which medicines to use  and how to use them. Carefully read the information on the package.   Everyone who is trying to quit may benefit from using a medicine. If you are pregnant or trying to become pregnant, nursing an infant, you are under age 49, or you smoke fewer than 10 cigarettes per day, talk to your caregiver before taking any nicotine replacement medicines.   You should stop using a nicotine replacement product and call your caregiver if you experience nausea, dizziness, weakness, vomiting, fast or irregular heartbeat, mouth problems with the lozenge or gum, or redness or swelling of the skin around the patch that does not go away.   Do not use any other product containing nicotine while using a nicotine replacement product.   Talk to your caregiver before using these products if you have diabetes, heart disease, asthma, stomach ulcers, you had a recent heart attack, you have high blood pressure that is not controlled with medicine, a history of irregular heartbeat, or you have been  prescribed medicine to help you quit smoking.  5. BE PREPARED FOR RELAPSE OR DIFFICULT SITUATIONS  Most relapses occur within the first 3 months after quitting. Do not be discouraged if you start smoking again. Remember, most people try several times before they finally quit.   You may have symptoms of withdrawal because your body is used to nicotine. You may crave cigarettes, be irritable, feel very hungry, cough often, get headaches, or have difficulty concentrating.   The withdrawal symptoms are only temporary. They are strongest when you first quit, but they will go away within 10 to 14 days.  Here are some difficult situations to watch for:  Alcohol. Avoid drinking alcohol. Drinking lowers your chances of successfully quitting.   Caffeine. Try to reduce the amount of caffeine you consume. It also lowers your chances of successfully quitting.   Other smokers. Being around smoking can make you want to smoke. Avoid smokers.     Weight gain. Many smokers will gain weight when they quit, usually less than 10 pounds. Eat a healthy diet and stay active. Do not let weight gain distract you from your main goal, quitting smoking. Some medicines that help you quit smoking may also help delay weight gain. You can always lose the weight gained after you quit.   Bad mood or depression. There are a lot of ways to improve your mood other than smoking.  If you are having problems with any of these situations, talk to your caregiver. SPECIAL SITUATIONS AND CONDITIONS Studies suggest that everyone can quit smoking. Your situation or condition can give you a special reason to quit.  Pregnant women/new mothers: By quitting, you protect your baby's health and your own.   Hospitalized patients: By quitting, you reduce health problems and help healing.   Heart attack patients: By quitting, you reduce your risk of a second heart attack.   Lung, head, and neck cancer patients: By quitting, you reduce your chance of a second cancer.   Parents of children and adolescents: By quitting, you protect your children from illnesses caused by secondhand smoke.  QUESTIONS TO THINK ABOUT Think about the following questions before you try to stop smoking. You may want to talk about your answers with your caregiver.  Why do you want to quit?   If you tried to quit in the past, what helped and what did not?   What will be the most difficult situations for you after you quit? How will you plan to handle them?   Who can help you through the tough times? Your family? Friends? Caregiver?   What pleasures do you get from smoking? What ways can you still get pleasure if you quit?  Here are some questions to ask your caregiver:  How can you help me to be successful at quitting?   What medicine do you think would be best for me and how should I take it?   What should I do if I need more help?   What is smoking withdrawal like? How can I get  information on withdrawal?  Quitting takes hard work and a lot of effort, but you can quit smoking. FOR MORE INFORMATION  Smokefree.gov (http://www.davis-sullivan.com/) provides free, accurate, evidence-based information and professional assistance to help support the immediate and long-term needs of people trying to quit smoking. Document Released: 03/19/2001 Document Revised: 12/05/2010 Document Reviewed: 01/09/2009 [facility] Patient Information 2012 [place], [place].

## 2011-05-13 ENCOUNTER — Other Ambulatory Visit: Payer: Self-pay | Admitting: *Deleted

## 2011-05-13 MED ORDER — HYDROCODONE-ACETAMINOPHEN 7.5-300 MG PO TABS: 7.5000 mg | ORAL_TABLET | Freq: Four times a day (QID) | ORAL | Status: DC | PRN

## 2011-05-13 MED ORDER — HYDROCODONE-ACETAMINOPHEN 7.5-500 MG PO TABS: 1.0000 | ORAL_TABLET | Freq: Four times a day (QID) | ORAL | Status: AC | PRN

## 2011-05-13 NOTE — Telephone Encounter (Signed)
rx  Called in  

## 2011-05-13 NOTE — Telephone Encounter (Signed)
Addended by: Valeda Malm L on: 05/13/2011 02:18 PM   Modules accepted: Orders

## 2011-05-15 ENCOUNTER — Telehealth: Payer: Self-pay | Admitting: *Deleted

## 2011-05-15 NOTE — Telephone Encounter (Signed)
Spoke with pt regarding the below and she said okay. Pt missed work today and wants to know if you could write her a note?

## 2011-05-15 NOTE — Telephone Encounter (Signed)
Please excuse patient from work today. She is status post laparoscopic surgery and will need to stay at home today to rest to help her recover.

## 2011-05-15 NOTE — Telephone Encounter (Signed)
Patient to BE reassured this these twinges and pulling sensation that she may feel on and off is not unusual since she's postop only 3 weeks from her ovaries cystectomy. She should slowly start tapering off for Lortab and taking ibuprofen as needed. Her pathology report was benign. If she still concerned she is more than welcome to come to the office to be examined.

## 2011-05-15 NOTE — Telephone Encounter (Signed)
Pt is post op laparoscopic ovarian cystectomy possible left salpingo-oophorectomy 04/24/11. Pt was given Prescription refill for Lortab 7.5/500 to take one by mouth every 4-6 hours when necessary on 05/06/11. Pt woke up this am with pelvic pain from hip to hip that lead into stomach area. Pt said she took the Lortab and it has helped but she still can feel shooting pain and cramping that comes and goes. I offered OV to pt and told me to ask if you think necessary?

## 2011-05-20 ENCOUNTER — Other Ambulatory Visit: Payer: Self-pay | Admitting: *Deleted

## 2011-05-30 ENCOUNTER — Other Ambulatory Visit: Payer: Self-pay | Admitting: Gynecology

## 2011-06-18 ENCOUNTER — Emergency Department (HOSPITAL_COMMUNITY)
Admission: EM | Admit: 2011-06-18 | Discharge: 2011-06-18 | Disposition: A | Discharge status: Home / Self Care | Payer: BC Managed Care – PPO | Attending: Emergency Medicine | Admitting: Emergency Medicine

## 2011-06-18 ENCOUNTER — Encounter (HOSPITAL_COMMUNITY): Payer: Self-pay | Admitting: Emergency Medicine

## 2011-06-18 ENCOUNTER — Emergency Department (HOSPITAL_COMMUNITY): Payer: BC Managed Care – PPO

## 2011-06-18 ENCOUNTER — Other Ambulatory Visit: Payer: Self-pay

## 2011-06-18 DIAGNOSIS — F17200 Nicotine dependence, unspecified, uncomplicated: Secondary | ICD-10-CM | POA: Insufficient documentation

## 2011-06-18 DIAGNOSIS — R1011 Right upper quadrant pain: Secondary | ICD-10-CM | POA: Insufficient documentation

## 2011-06-18 DIAGNOSIS — R112 Nausea with vomiting, unspecified: Secondary | ICD-10-CM | POA: Insufficient documentation

## 2011-06-18 DIAGNOSIS — R059 Cough, unspecified: Secondary | ICD-10-CM | POA: Insufficient documentation

## 2011-06-18 DIAGNOSIS — R05 Cough: Secondary | ICD-10-CM | POA: Insufficient documentation

## 2011-06-18 LAB — CBC
HCT: 39.5 % (ref 36.0–46.0)
MCH: 29.5 pg (ref 26.0–34.0)
MCHC: 34.2 g/dL (ref 30.0–36.0)
MCV: 86.2 fL (ref 78.0–100.0)
RDW: 13.6 % (ref 11.5–15.5)

## 2011-06-18 LAB — COMPREHENSIVE METABOLIC PANEL
AST: 14 U/L (ref 0–37)
Albumin: 4.3 g/dL (ref 3.5–5.2)
Calcium: 9.5 mg/dL (ref 8.4–10.5)
Creatinine, Ser: 0.54 mg/dL (ref 0.50–1.10)
GFR calc non Af Amer: >90 mL/min (ref >90)

## 2011-06-18 LAB — URINALYSIS, ROUTINE W REFLEX MICROSCOPIC
Glucose, UA: NEGATIVE mg/dL
Hgb urine dipstick: NEGATIVE
Leukocytes, UA: NEGATIVE
Specific Gravity, Urine: 1.02 (ref 1.005–1.030)
Urobilinogen, UA: 0.2 mg/dL (ref 0.0–1.0)

## 2011-06-18 LAB — D-DIMER, QUANTITATIVE: D-Dimer, Quant: 0.22 ug/mL-FEU (ref 0.00–0.48)

## 2011-06-18 LAB — DIFFERENTIAL
Basophils Absolute: 0.1 10*3/uL (ref 0.0–0.1)
Basophils Relative: 1 % (ref 0–1)
Eosinophils Relative: 1 % (ref 0–5)
Monocytes Absolute: 0.6 10*3/uL (ref 0.1–1.0)

## 2011-06-18 LAB — POCT PREGNANCY, URINE: Preg Test, Ur: NEGATIVE

## 2011-06-18 LAB — RAPID URINE DRUG SCREEN, HOSP PERFORMED: Benzodiazepines: POSITIVE — AB

## 2011-06-18 MED ORDER — HYDROCODONE-ACETAMINOPHEN 5-325 MG PO TABS
1.0000 | ORAL_TABLET | Freq: Once | ORAL | Status: AC
  Administered 2011-06-18: 1 via ORAL
  Filled 2011-06-18: qty 1

## 2011-06-18 MED ORDER — KETOROLAC TROMETHAMINE 60 MG/2ML IM SOLN
60.0000 mg | Freq: Once | INTRAMUSCULAR | Status: AC
  Administered 2011-06-18: 60 mg via INTRAMUSCULAR
  Filled 2011-06-18: qty 2

## 2011-06-18 MED ORDER — IBUPROFEN 600 MG PO TABS: 600.0000 mg | ORAL_TABLET | Freq: Four times a day (QID) | ORAL | Status: AC | PRN

## 2011-06-18 MED ORDER — OXYCODONE-ACETAMINOPHEN 5-325 MG PO TABS: 1.0000 | ORAL_TABLET | ORAL | Status: AC | PRN

## 2011-06-18 NOTE — ED Provider Notes (Signed)
I saw and evaluated the patient, reviewed the resident's note and I agree with the findings and plan.   .Face to face Exam:  General:  Awake HEENT:  Atraumatic Resp:  Normal effort Abd:  Nondistended Neuro:No focal weakness Lymph: No adenopathy   Nelia Shi, MD 06/18/11 [id number]

## 2011-06-18 NOTE — ED Provider Notes (Signed)
History     CSN: [id number]  Arrival date & time 06/18/11  1627   First MD Initiated Contact with Patient 06/18/11 1909      Chief Complaint  Patient presents with  . Abdominal Pain    (Consider location/radiation/quality/duration/timing/severity/associated sxs/prior treatment) HPI Comments: 31 year old female with a history of right-sided rib fracture complicated by liver laceration several years ago who is status post laparoscopic left ovarian cystectomy on 1/15 presenting with a month and a half of right upper quadrant abdominal pain that is gradually worsening. Pain was initially intermittent but now is constant. It is pleuritic but she denies shortness of breath. She endorses a mild cough but denies hemoptysis. No fever. Vomited twice 2 days ago but has not vomited since. No change in bowel habits. No leg swelling. No history of DVT/PE.  Patient is a 31 y.o. female presenting with abdominal pain. The history is provided by the patient.  Abdominal Pain The primary symptoms of the illness include abdominal pain, nausea and vomiting (2 days ago.). The primary symptoms of the illness do not include shortness of breath. The current episode started more than 2 days ago (About a month and a half ago.). The onset of the illness was gradual. The problem has been gradually worsening.  Associated with: Patient noticed pain roughly 2 weeks after a laparoscopic ovarian cystectomy. The patient states that she believes she is currently not pregnant. The patient has not had a change in bowel habit. Symptoms associated with the illness do not include chills, constipation, urgency, hematuria or frequency.    Past Medical History  Diagnosis Date  . Smoker     5 CIGS A DAY  . Endometriosis   . Ovarian cyst   . Cervicitis   . Asthma     as child, no prob as adult, no inhaler  . Anemia     history with pregnancy, no current prob  . Anxiety     no meds    Past Surgical History  Procedure Date    . Cervical biopsy  w/ loop electrode excision 02/2000  . Cesarean section 02/2001  . Wisdom tooth extraction   . Laparoscopy 04/24/2011    Procedure: LAPAROSCOPY OPERATIVE;  Surgeon: Ok Edwards, MD;  Location: [facility];  Service: Gynecology;  Laterality: N/A;  pelvic washings  . Ovarian cyst removal 04/24/2011    Procedure: OVARIAN CYSTECTOMY;  Surgeon: Ok Edwards, MD;  Location: [facility];  Service: Gynecology;  Laterality: Left;    Family History  Problem Relation Age of Onset  . Adopted: Yes    History  Substance Use Topics  . Smoking status: Current Everyday Smoker -- 0.3 packs/day for 12 years    Types: Cigarettes  . Smokeless tobacco: Never Used  . Alcohol Use: No    OB History    Grav Para Term Preterm Abortions TAB SAB Ect Mult Living   1 1 1       1       Review of Systems  Constitutional: Negative for chills.  Respiratory: Negative for chest tightness and shortness of breath.   Cardiovascular: Negative for chest pain and leg swelling.  Gastrointestinal: Positive for nausea, vomiting (2 days ago.) and abdominal pain. Negative for constipation.  Genitourinary: Negative for urgency, frequency and hematuria.  Neurological: Negative for dizziness and syncope.  All other systems reviewed and are negative.    Allergies  Benzoyl peroxide  Home Medications   Current Outpatient Rx  Name Route Sig Dispense Refill  .  ALPRAZOLAM 1 MG PO TABS Oral Take 1 mg by mouth daily as needed. For anxiety    . HYDROCODONE-ACETAMINOPHEN 7.5-500 MG PO TABS Oral Take 7.5 tablets by mouth Every 6 hours as needed. For pain    . IBUPROFEN 200 MG PO TABS Oral Take 800 mg by mouth every 8 (eight) hours as needed. For    . NORGESTIMATE-ETH ESTRADIOL 0.25-35 MG-MCG PO TABS Oral Take 1 tablet by mouth daily. 1 Package 11  . TRAMADOL HCL 50 MG PO TABS Oral Take 50 mg by mouth every 6 (six) hours as needed. Maximum dose= 8 tablets per day  For pain      BP 139/94  Pulse 96   Temp(Src) 97.4 F (36.3 C) (Oral)  Resp 16  SpO2 98%  Physical Exam  Nursing note and vitals reviewed. Constitutional: She is oriented to person, place, and time. She appears well-developed and well-nourished. No distress.  HENT:  Head: Normocephalic and atraumatic.  Mouth/Throat: Oropharynx is clear and moist.  Eyes: EOM are normal. Pupils are equal, round, and reactive to light.  Neck: Normal range of motion. Neck supple.  Cardiovascular: Normal rate, regular rhythm and normal heart sounds.  Exam reveals no friction rub.   No murmur heard. Pulmonary/Chest: Effort normal and breath sounds normal. No respiratory distress. She has no wheezes. She has no rales.  Abdominal: Soft. There is tenderness (Moderate tenderness in right upper quadrant.). There is no rebound and no guarding.       2 small laparoscopic abdominal incisions and lower abdomen that appear well-healing and intact. There is no surrounding erythema, tenderness, fluctuance or induration.  Musculoskeletal: Normal range of motion. She exhibits no edema and no tenderness.  Lymphadenopathy:    She has no cervical adenopathy.  Neurological: She is alert and oriented to person, place, and time.  Skin: Skin is warm and dry. No rash noted.  Psychiatric: She has a normal mood and affect. Her behavior is normal.    ED Course  Procedures (including critical care time)  Results for orders placed during the hospital encounter of 06/18/11  CBC      Component Value Range   WBC 9.1  4.0 - 10.5 (K/uL)   RBC 4.58  3.87 - 5.11 (MIL/uL)   Hemoglobin 13.5  12.0 - 15.0 (g/dL)   HCT 14.7  82.9 - 56.2 (%)   MCV 86.2  78.0 - 100.0 (fL)   MCH 29.5  26.0 - 34.0 (pg)   MCHC 34.2  30.0 - 36.0 (g/dL)   RDW 13.0  86.5 - 78.4 (%)   Platelets 216  150 - 400 (K/uL)  DIFFERENTIAL      Component Value Range   Neutrophils Relative 58  43 - 77 (%)   Neutro Abs 5.2  1.7 - 7.7 (K/uL)   Lymphocytes Relative 34  12 - 46 (%)   Lymphs Abs 3.1  0.7 -  4.0 (K/uL)   Monocytes Relative 7  3 - 12 (%)   Monocytes Absolute 0.6  0.1 - 1.0 (K/uL)   Eosinophils Relative 1  0 - 5 (%)   Eosinophils Absolute 0.1  0.0 - 0.7 (K/uL)   Basophils Relative 1  0 - 1 (%)   Basophils Absolute 0.1  0.0 - 0.1 (K/uL)  COMPREHENSIVE METABOLIC PANEL      Component Value Range   Sodium 139  135 - 145 (mEq/L)   Potassium 3.6  3.5 - 5.1 (mEq/L)   Chloride 102  96 - 112 (mEq/L)  CO2 27  19 - 32 (mEq/L)   Glucose, Bld 82  70 - 99 (mg/dL)   BUN 9  6 - 23 (mg/dL)   Creatinine, Ser 2.95  0.50 - 1.10 (mg/dL)   Calcium 9.5  8.4 - 62.1 (mg/dL)   Total Protein 7.0  6.0 - 8.3 (g/dL)   Albumin 4.3  3.5 - 5.2 (g/dL)   AST 14  0 - 37 (U/L)   ALT 6  0 - 35 (U/L)   Alkaline Phosphatase 57  39 - 117 (U/L)   Total Bilirubin 0.4  0.3 - 1.2 (mg/dL)   GFR calc non Af Amer >90  >90 (mL/min)   GFR calc Af Amer >90  >90 (mL/min)  URINALYSIS, ROUTINE W REFLEX MICROSCOPIC      Component Value Range   Color, Urine YELLOW  YELLOW    APPearance CLOUDY (*) CLEAR    Specific Gravity, Urine 1.020  1.005 - 1.030    pH 6.0  5.0 - 8.0    Glucose, UA NEGATIVE  NEGATIVE (mg/dL)   Hgb urine dipstick NEGATIVE  NEGATIVE    Bilirubin Urine NEGATIVE  NEGATIVE    Ketones, ur NEGATIVE  NEGATIVE (mg/dL)   Protein, ur NEGATIVE  NEGATIVE (mg/dL)   Urobilinogen, UA 0.2  0.0 - 1.0 (mg/dL)   Nitrite NEGATIVE  NEGATIVE    Leukocytes, UA NEGATIVE  NEGATIVE   POCT PREGNANCY, URINE      Component Value Range   Preg Test, Ur NEGATIVE  NEGATIVE   D-DIMER, QUANTITATIVE      Component Value Range   D-Dimer, Quant 0.22  0.00 - 0.48 (ug/mL-FEU)  URINE RAPID DRUG SCREEN (HOSP PERFORMED)      Component Value Range   Opiates NONE DETECTED  NONE DETECTED    Cocaine NONE DETECTED  NONE DETECTED    Benzodiazepines POSITIVE (*) NONE DETECTED    Amphetamines NONE DETECTED  NONE DETECTED    Tetrahydrocannabinol POSITIVE (*) NONE DETECTED    Barbiturates NONE DETECTED  NONE DETECTED     Dg Chest 2  View  06/18/2011  *RADIOLOGY REPORT*  Clinical Data: Pain below breasts  CHEST - 2 VIEW  Comparison: 07/22/2005  Findings: Borderline cardiomegaly.  Lungs are hyperaerated and clear.  No pneumothorax.  IMPRESSION: No active cardiopulmonary disease.  Cardiomegaly.  Original Report Authenticated By: Donavan Burnet, M.D.   Imaging independently viewed by me, interpreted by radiologist.   Date: 06/18/2011  Rate: 64  Rhythm: normal sinus rhythm  QRS Axis: normal  Intervals: normal  ST/T Wave abnormalities: normal  Conduction Disutrbances:none  Narrative Interpretation:   Old EKG Reviewed: none available   1. Abdominal pain       MDM  27:78 PM 31 year old female status post left ovarian cystectomy 2 months ago with a history of right-sided rib fracture complicated by liver laceration several years ago presenting with a month and a half history of gradually worsening right upper quadrant pain. She states pain was onset roughly 2 weeks after her surgery. The pain initially was intermittent but now is constant and states it feels bruised. Pain is pleuritic, but she denies shortness of breath. She is on birth control and is a smoker but denies any history of DVT/PE. She endorses a cough which he attributes to her smoking but denies hemoptysis. She has had no leg swelling. Exam reveals tenderness in the right upper quadrant. She states her surgeon took a picture of her liver during her laparoscopic surgery and tells me there was  a "white fibrinous material". PE versus gallbladder versus liver is on differential. She has had no fever. Will check cbc, cmp, ekg, cxr. Will also check d dimer as she is a smoker on OCPs and is borderline tachycardic in upper 90s.   10:32 PM chest x-ray is negative. D-dimer is negative. Her vitals have remained stable. Heart rate is currently in the 70s. On reevaluation patient still having some discomfort. Bedside ultrasound did not reveal gallstones. CMP is unremarkable.  Given persistent pain for a month and a half along with the negative CMP, no leukocytosis and bedside ultrasound that did not show gallstones doubt this is acute cholecystitis. She has followup scheduled with her primary physician. She was informed that she can call for possible earlier available appointment. She also has followup with her GYN doctor scheduled. Patient was offered more pain medicine but she refused. She was given strong return precautions and discharged home in stable condition.      Sheran Luz, MD 06/18/11 2246

## 2011-06-18 NOTE — Discharge Instructions (Signed)

## 2011-06-18 NOTE — ED Notes (Signed)
Pt here c/o increasing pain in here left rib area after having laparoscopic sx 2 months ago; pt sts had left ovarian cyst removal; pt sts had old injury with fractured left sided rib with liver lac; pt denies new injury

## 2011-07-09 ENCOUNTER — Emergency Department (HOSPITAL_COMMUNITY): Payer: BC Managed Care – PPO

## 2011-07-09 ENCOUNTER — Encounter (HOSPITAL_COMMUNITY): Payer: Self-pay

## 2011-07-09 ENCOUNTER — Emergency Department (HOSPITAL_COMMUNITY)
Admission: EM | Admit: 2011-07-09 | Discharge: 2011-07-09 | Disposition: A | Discharge status: Home / Self Care | Payer: BC Managed Care – PPO | Attending: Emergency Medicine | Admitting: Emergency Medicine

## 2011-07-09 DIAGNOSIS — R0789 Other chest pain: Secondary | ICD-10-CM | POA: Insufficient documentation

## 2011-07-09 DIAGNOSIS — J40 Bronchitis, not specified as acute or chronic: Secondary | ICD-10-CM

## 2011-07-09 DIAGNOSIS — R062 Wheezing: Secondary | ICD-10-CM | POA: Insufficient documentation

## 2011-07-09 DIAGNOSIS — R05 Cough: Secondary | ICD-10-CM | POA: Insufficient documentation

## 2011-07-09 DIAGNOSIS — R059 Cough, unspecified: Secondary | ICD-10-CM | POA: Insufficient documentation

## 2011-07-09 HISTORY — DX: Other specified postprocedural states: Z87.42

## 2011-07-09 HISTORY — DX: Other specified postprocedural states: Z98.890

## 2011-07-09 LAB — URINALYSIS, ROUTINE W REFLEX MICROSCOPIC
Bilirubin Urine: NEGATIVE
Glucose, UA: NEGATIVE mg/dL
Hgb urine dipstick: NEGATIVE
Ketones, ur: 15 mg/dL — AB
Leukocytes, UA: NEGATIVE
Nitrite: NEGATIVE
Protein, ur: NEGATIVE mg/dL
Specific Gravity, Urine: 1.024 (ref 1.005–1.030)
Urobilinogen, UA: 0.2 mg/dL (ref 0.0–1.0)
pH: 5.5 (ref 5.0–8.0)

## 2011-07-09 MED ORDER — ALBUTEROL SULFATE HFA 108 (90 BASE) MCG/ACT IN AERS
1.0000 | INHALATION_SPRAY | Freq: Four times a day (QID) | RESPIRATORY_TRACT | Status: DC | PRN
  Administered 2011-07-09: 2 via RESPIRATORY_TRACT
  Filled 2011-07-09: qty 6.7

## 2011-07-09 MED ORDER — AZITHROMYCIN 250 MG PO TABS: 250.0000 mg | ORAL_TABLET | Freq: Every day | ORAL | Status: AC

## 2011-07-09 MED ORDER — HYDROCOD POLST-CHLORPHEN POLST 10-8 MG/5ML PO LQCR
5.0000 mL | Freq: Once | ORAL | Status: AC
  Administered 2011-07-09: 5 mL via ORAL
  Filled 2011-07-09: qty 5

## 2011-07-09 MED ORDER — IPRATROPIUM BROMIDE 0.02 % IN SOLN
0.5000 mg | Freq: Once | RESPIRATORY_TRACT | Status: AC
  Administered 2011-07-09: 0.5 mg via RESPIRATORY_TRACT
  Filled 2011-07-09: qty 2.5

## 2011-07-09 MED ORDER — ALBUTEROL SULFATE (5 MG/ML) 0.5% IN NEBU
5.0000 mg | INHALATION_SOLUTION | Freq: Once | RESPIRATORY_TRACT | Status: AC
  Administered 2011-07-09: 5 mg via RESPIRATORY_TRACT
  Filled 2011-07-09: qty 1

## 2011-07-09 MED ORDER — HYDROCOD POLST-CHLORPHEN POLST 10-8 MG/5ML PO LQCR: 5.0000 mL | Freq: Two times a day (BID) | ORAL | Status: DC | PRN

## 2011-07-09 NOTE — ED Notes (Signed)
On/off cough and sinus/chest congestion x 5 days.  States pain to RT side of chest, RT side to RT back.  Mainly dry cough.  Has had fever, but not today.

## 2011-07-09 NOTE — ED Provider Notes (Signed)
History     CSN: [id number]  Arrival date & time 07/09/11  1603   First MD Initiated Contact with Patient 07/09/11 1859      No chief complaint on file.   (Consider location/radiation/quality/duration/timing/severity/associated sxs/prior treatment) HPI Comments: Patient reports that she is a smoker but denies prior history of pneumonia, has had some chest congestion and a dry cough for about 5 days but reports last night she has had much worsening chest discomfort mainly on the right side that seems somewhat worse with coughing. She reports is somewhat of a tight sensation as well as sharp. She reports that she had a childhood history of asthma but not currently. She denies recent long distance travel, fever, lower extremity pain or swelling. She has been taking decongestants at home and she reports due to the pain she feels like she needs to produce congestion and cough out but she is unable to. In particular, the patient was concerned about the possibility of pneumonia. No vomiting or diarrhea. No skin rash. She reports she did have her influenza vaccination this season.  The history is provided by the patient.    Past Medical History  Diagnosis Date  . Smoker     5 CIGS A DAY  . Endometriosis   . Ovarian cyst   . Cervicitis   . Asthma     as child, no prob as adult, no inhaler  . Anemia     history with pregnancy, no current prob  . Anxiety     no meds  . History of ovarian cystectomy     Past Surgical History  Procedure Date  . Cervical biopsy  w/ loop electrode excision 02/2000  . Cesarean section 02/2001  . Wisdom tooth extraction   . Laparoscopy 04/24/2011    Procedure: LAPAROSCOPY OPERATIVE;  Surgeon: Ok Edwards, MD;  Location: [facility];  Service: Gynecology;  Laterality: N/A;  pelvic washings  . Ovarian cyst removal 04/24/2011    Procedure: OVARIAN CYSTECTOMY;  Surgeon: Ok Edwards, MD;  Location: [facility];  Service: Gynecology;  Laterality: Left;    Family  History  Problem Relation Age of Onset  . Adopted: Yes    History  Substance Use Topics  . Smoking status: Current Everyday Smoker -- 0.2 packs/day for 12 years    Types: Cigarettes  . Smokeless tobacco: Never Used  . Alcohol Use: No    OB History    Grav Para Term Preterm Abortions TAB SAB Ect Mult Living   1 1 1       1       Review of Systems  Constitutional: Negative for fever and chills.  HENT: Positive for congestion. Negative for ear pain.   Respiratory: Positive for cough, chest tightness and shortness of breath.   Gastrointestinal: Negative for nausea, vomiting and diarrhea.  All other systems reviewed and are negative.    Allergies  Benzoyl peroxide  Home Medications   Current Outpatient Rx  Name Route Sig Dispense Refill  . IBUPROFEN 200 MG PO TABS Oral Take 800 mg by mouth every 8 (eight) hours as needed. For    . NORGESTIMATE-ETH ESTRADIOL 0.25-35 MG-MCG PO TABS Oral Take 1 tablet by mouth daily. 1 Package 11  . PANTOPRAZOLE SODIUM 40 MG PO TBEC Oral Take 40 mg by mouth 2 (two) times daily.    . AZITHROMYCIN 250 MG PO TABS Oral Take 1 tablet (250 mg total) by mouth daily. Take first 2 tablets together, then 1 every  day until finished. 6 tablet 0  . HYDROCOD POLST-CPM POLST ER 10-8 MG/5ML PO LQCR Oral Take 5 mLs by mouth every 12 (twelve) hours as needed. 140 mL 0    BP 121/66  Pulse 71  Temp(Src) 97.6 F (36.4 C) (Oral)  Resp 18  SpO2 100%  LMP 06/25/2011  Physical Exam  Nursing note and vitals reviewed. Constitutional: She is oriented to person, place, and time. She appears well-developed and well-nourished. No distress.  HENT:  Head: Normocephalic and atraumatic.  Eyes: Pupils are equal, round, and reactive to light.  Neck: Normal range of motion. Neck supple.  Cardiovascular: Normal rate and regular rhythm.   Pulmonary/Chest: Effort normal. No accessory muscle usage. No respiratory distress. She has wheezes in the right middle field, the left  upper field and the left middle field. She has no rhonchi. She has no rales.  Abdominal: Soft. Normal appearance and bowel sounds are normal. There is no tenderness.  Musculoskeletal: She exhibits no edema.  Neurological: She is alert and oriented to person, place, and time.  Skin: Skin is warm and dry. No rash noted.    ED Course  Procedures (including critical care time)  Labs Reviewed  URINALYSIS, ROUTINE W REFLEX MICROSCOPIC - Abnormal; Notable for the following:    APPearance CLOUDY (*)    Ketones, ur 15 (*)    All other components within normal limits  POCT PREGNANCY, URINE   Dg Chest 2 View  07/09/2011  *RADIOLOGY REPORT*  Clinical Data: Cough, congestion  CHEST - 2 VIEW  Comparison: 06/18/2011  Findings: Cardiomediastinal silhouette is stable.  No acute infiltrate or pulmonary edema.  Mild perihilar increased bronchial markings suspicious for bronchitic changes. Bony thorax is stable.  IMPRESSION: No acute infiltrate or pulmonary edema.  Mild perihilar increased bronchial markings suspicious for mild bronchitic changes.  Original Report Authenticated By: Natasha Mead, M.D.     1. Bronchitis     Room air saturation is 98% and normal. Patient is nontoxic in appearance. I did review the patient's chest x-ray myself.  MDM   Expiratory wheezing along with her symptoms suggest bronchitis which certainly could explain chest tightness and discomfort. Patient is reassured that the chest x-ray did not show any evidence of pneumonia. However given her length of symptoms, will treat her with a Z-Pak as well as albuterol inhaler and Tussionex for pain and congestion. The patient will be given a nebulizer treatment of albuterol and Atrovent here initially as well. Patient is reassured and agrees with plan to take medication as prescribed he can followup with primary care physician next week if not improving.        Gavin Pound. Oletta Lamas, MD 07/09/11 1941

## 2011-07-09 NOTE — Discharge Instructions (Signed)

## 2011-08-19 ENCOUNTER — Ambulatory Visit (INDEPENDENT_AMBULATORY_CARE_PROVIDER_SITE_OTHER): Payer: BC Managed Care – PPO

## 2011-08-19 ENCOUNTER — Ambulatory Visit (INDEPENDENT_AMBULATORY_CARE_PROVIDER_SITE_OTHER): Payer: BC Managed Care – PPO | Admitting: Gynecology

## 2011-08-19 ENCOUNTER — Encounter: Payer: Self-pay | Admitting: Gynecology

## 2011-08-19 VITALS — BP 120/82

## 2011-08-19 DIAGNOSIS — R109 Unspecified abdominal pain: Secondary | ICD-10-CM

## 2011-08-19 DIAGNOSIS — N831 Corpus luteum cyst of ovary, unspecified side: Secondary | ICD-10-CM

## 2011-08-19 DIAGNOSIS — N83209 Unspecified ovarian cyst, unspecified side: Secondary | ICD-10-CM

## 2011-08-19 DIAGNOSIS — N949 Unspecified condition associated with female genital organs and menstrual cycle: Secondary | ICD-10-CM

## 2011-08-19 DIAGNOSIS — R1030 Lower abdominal pain, unspecified: Secondary | ICD-10-CM

## 2011-08-19 DIAGNOSIS — N854 Malposition of uterus: Secondary | ICD-10-CM

## 2011-08-19 LAB — URINALYSIS W MICROSCOPIC + REFLEX CULTURE
Bilirubin Urine: NEGATIVE
Crystals: NONE SEEN
Glucose, UA: NEGATIVE mg/dL
Ketones, ur: NEGATIVE mg/dL
Protein, ur: NEGATIVE mg/dL
Specific Gravity, Urine: 1.025 (ref 1.005–1.030)
Urobilinogen, UA: 0.2 mg/dL (ref 0.0–1.0)
WBC, UA: NONE SEEN WBC/hpf (ref <3)

## 2011-08-19 LAB — PREGNANCY, URINE: Preg Test, Ur: NEGATIVE

## 2011-08-19 MED ORDER — HYDROMORPHONE HCL 2 MG PO TABS: 2.0000 mg | ORAL_TABLET | Freq: Four times a day (QID) | ORAL | Status: AC | PRN

## 2011-08-19 NOTE — Progress Notes (Signed)
31 year old patient who presented to the office complaining over the past week of left lower quadrant pain pain is intense and the cramping also occurs at orgasm. Patient is currently on oral contraceptive pill has had good compliance. Review of the patient's record indicated that on January 16 of this year the patient underwent a left ovarian cystectomy with the following pathology report:  Ovary, cyst, left - BENIGN FOLLICULAR CYST AND BLOOD CLOT. - NO ENDOMETRIOSIS OR EVIDENCE OF MALIGNANCY  Exam: Abdomen: Soft tenderness in the left lower quadrant Pelvic: Bartholin urethra Skene was within normal limits Vagina: No lesions or discharge Cervix: No lesions or discharge Uterus: Retroverted tenderness to the left lower quadrant difficult to evaluate adnexa due to discomfort Rectal: Unremarkable  Urinalysis negative  Ultrasound: Uterus measures 6.7 x 5.4 x 4.5 cm with endometrial stripe is 7.1 mm. Fluid-filled endometrial cavity 13 x 7 mm. Right ovary was normal. Left ovary thickwalled mass with positive color flow in the periphery measuring 27 x 25 x 19 mm hypoechoic solid focus surrounded by free fluid was seen in the cul-de-sac.  Assessment/plan: Apparent left hemorrhagic cyst appears to be in the cause of her symptoms. Urine pregnancy pending. Urinalysis was negative. CBC results pending. Patient was reassured she has questionable allergies to codeine-containing products. She'll be prescribed Dilaudid 2 mg to take 1 by mouth every 6 hours when necessary. We discussed for her to start taking the oral contraceptive pill in the following fashion: She should take it continuously and withdrawal every 3 months. This will help for ovarian cyst suppression and help with her dysmenorrhea and menorrhagia.

## 2011-08-19 NOTE — Patient Instructions (Signed)
Ovarian Cyst The ovaries are small organs that are on each side of the uterus. The ovaries are the organs that produce the female hormones, estrogen and progesterone. An ovarian cyst is a sac filled with fluid that can vary in its size. It is normal for a small cyst to form in women who are in the childbearing age and who have menstrual periods. This type of cyst is called a follicle cyst that becomes an ovulation cyst (corpus luteum cyst) after it produces the women's egg. It later goes away on its own if the woman does not become pregnant. There are other kinds of ovarian cysts that may cause problems and may need to be treated. The most serious problem is a cyst with cancer. It should be noted that menopausal women who have an ovarian cyst are at a higher risk of it being a cancer cyst. They should be evaluated very quickly, thoroughly and followed closely. This is especially true in menopausal women because of the high rate of ovarian cancer in women in menopause. CAUSES AND TYPES OF OVARIAN CYSTS:  FUNCTIONAL CYST: The follicle/corpus luteum cyst is a functional cyst that occurs every month during ovulation with the menstrual cycle. They go away with the next menstrual cycle if the woman does not get pregnant. Usually, there are no symptoms with a functional cyst.   ENDOMETRIOMA CYST: This cyst develops from the lining of the uterus tissue. This cyst gets in or on the ovary. It grows every month from the bleeding during the menstrual period. It is also called a "chocolate cyst" because it becomes filled with blood that turns brown. This cyst can cause pain in the lower abdomen during intercourse and with your menstrual period.   CYSTADENOMA CYST: This cyst develops from the cells on the outside of the ovary. They usually are not cancerous. They can get very big and cause lower abdomen pain and pain with intercourse. This type of cyst can twist on itself, cut off its blood supply and cause severe pain.  It also can easily rupture and cause a lot of pain.   DERMOID CYST: This type of cyst is sometimes found in both ovaries. They are found to have different kinds of body tissue in the cyst. The tissue includes skin, teeth, hair, and/or cartilage. They usually do not have symptoms unless they get very big. Dermoid cysts are rarely cancerous.   POLYCYSTIC OVARY: This is a rare condition with hormone problems that produces many small cysts on both ovaries. The cysts are follicle-like cysts that never produce an egg and become a corpus luteum. It can cause an increase in body weight, infertility, acne, increase in body and facial hair and lack of menstrual periods or rare menstrual periods. Many women with this problem develop type 2 diabetes. The exact cause of this problem is unknown. A polycystic ovary is rarely cancerous.   THECA LUTEIN CYST: Occurs when too much hormone (human chorionic gonadotropin) is produced and over-stimulates the ovaries to produce an egg. They are frequently seen when doctors stimulate the ovaries for invitro-fertilization (test tube babies).   LUTEOMA CYST: This cyst is seen during pregnancy. Rarely it can cause an obstruction to the birth canal during labor and delivery. They usually go away after delivery.  SYMPTOMS   Pelvic pain or pressure.   Pain during sexual intercourse.   Increasing girth (swelling) of the abdomen.   Abnormal menstrual periods.   Increasing pain with menstrual periods.   You stop having   menstrual periods and you are not pregnant.  DIAGNOSIS  The diagnosis can be made during:  Routine or annual pelvic examination (common).   Ultrasound.   X-ray of the pelvis.   CT Scan.   MRI.   Blood tests.  TREATMENT   Treatment may only be to follow the cyst monthly for 2 to 3 months with your caregiver. Many go away on their own, especially functional cysts.   May be aspirated (drained) with a long needle with ultrasound, or by laparoscopy  (inserting a tube into the pelvis through a small incision).   The whole cyst can be removed by laparoscopy.   Sometimes the cyst may need to be removed through an incision in the lower abdomen.   Hormone treatment is sometimes used to help dissolve certain cysts.   Birth control pills are sometimes used to help dissolve certain cysts.  HOME CARE INSTRUCTIONS  Follow your caregiver's advice regarding:  Medicine.   Follow up visits to evaluate and treat the cyst.   You may need to come back or make an appointment with another caregiver, to find the exact cause of your cyst, if your caregiver is not a gynecologist.   Get your yearly and recommended pelvic examinations and Pap tests.   Let your caregiver know if you have had an ovarian cyst in the past.  SEEK MEDICAL CARE IF:   Your periods are late, irregular, they stop, or are painful.   Your stomach (abdomen) or pelvic pain does not go away.   Your stomach becomes larger or swollen.   You have pressure on your bladder or trouble emptying your bladder completely.   You have painful sexual intercourse.   You have feelings of fullness, pressure, or discomfort in your stomach.   You lose weight for no apparent reason.   You feel generally ill.   You become constipated.   You lose your appetite.   You develop acne.   You have an increase in body and facial hair.   You are gaining weight, without changing your exercise and eating habits.   You think you are pregnant.  SEEK IMMEDIATE MEDICAL CARE IF:   You have increasing abdominal pain.   You feel sick to your stomach (nausea) and/or vomit.   You develop a fever that comes on suddenly.   You develop abdominal pain during a bowel movement.   Your menstrual periods become heavier than usual.  Document Released: 03/25/2005 Document Revised: 03/14/2011 Document Reviewed: 01/26/2009 [employer] Patient Information 2012 [employer]. 

## 2011-08-20 LAB — CBC WITH DIFFERENTIAL/PLATELET
Basophils Absolute: 0.1 10*3/uL (ref 0.0–0.1)
Basophils Relative: 1 % (ref 0–1)
Eosinophils Absolute: 0.2 10*3/uL (ref 0.0–0.7)
Hemoglobin: 11.6 g/dL — ABNORMAL LOW (ref 12.0–15.0)
MCHC: 31.9 g/dL (ref 30.0–36.0)
Monocytes Relative: 5 % (ref 3–12)
Neutro Abs: 5.8 10*3/uL (ref 1.7–7.7)
Neutrophils Relative %: 62 % (ref 43–77)
Platelets: 241 10*3/uL (ref 150–400)

## 2011-08-22 NOTE — Progress Notes (Signed)
Informed K[facility] 

## 2011-09-04 ENCOUNTER — Encounter: Payer: BC Managed Care – PPO | Admitting: Gynecology

## 2011-09-16 ENCOUNTER — Telehealth: Payer: Self-pay | Admitting: *Deleted

## 2011-09-16 DIAGNOSIS — R102 Pelvic and perineal pain: Secondary | ICD-10-CM

## 2011-09-16 MED ORDER — TRAMADOL HCL 50 MG PO TABS
50.0000 mg | ORAL_TABLET | Freq: Four times a day (QID) | ORAL | Status: AC | PRN
Start: 2011-09-16 — End: 2011-09-26

## 2011-09-16 NOTE — Telephone Encounter (Signed)
Pt is calling c/o pelvic pain, she has annual scheduled on Friday 09/20/11. C/o left ovary pain same as OV in 08/19/11 sharp cramping pain. Pt would like something to relieve pain until OV on Friday. Please advise

## 2011-09-16 NOTE — Telephone Encounter (Signed)
Left message- rx has been sent to pharmacy.  

## 2011-09-16 NOTE — Telephone Encounter (Signed)
Please see if we can do an ultrasound on the same day that she is coming in for annual exam. If she is not allergic we can prescribe Ultram 50 mg one tablet every 6 hours when necessary #30.

## 2011-09-17 NOTE — Telephone Encounter (Addendum)
Left message on voicemail that ultrasound will be needed as well, will have front desk to schedule. Order placed

## 2011-09-17 NOTE — Telephone Encounter (Signed)
Addended by: Aura Camps on: 09/17/2011 08:50 AM   Modules accepted: Orders

## 2011-09-20 ENCOUNTER — Encounter: Payer: Self-pay | Admitting: Gynecology

## 2011-09-20 ENCOUNTER — Encounter: Payer: BC Managed Care – PPO | Admitting: Gynecology

## 2011-09-20 ENCOUNTER — Other Ambulatory Visit (HOSPITAL_COMMUNITY)
Admission: RE | Admit: 2011-09-20 | Discharge: 2011-09-20 | Disposition: A | Discharge status: Home / Self Care | Payer: BC Managed Care – PPO | Source: Ambulatory Visit | Attending: Gynecology | Admitting: Gynecology

## 2011-09-20 ENCOUNTER — Ambulatory Visit (INDEPENDENT_AMBULATORY_CARE_PROVIDER_SITE_OTHER): Payer: BC Managed Care – PPO | Admitting: Gynecology

## 2011-09-20 ENCOUNTER — Ambulatory Visit (INDEPENDENT_AMBULATORY_CARE_PROVIDER_SITE_OTHER): Payer: BC Managed Care – PPO

## 2011-09-20 VITALS — BP 108/72 | Ht 65.5 in | Wt 135.0 lb

## 2011-09-20 DIAGNOSIS — N946 Dysmenorrhea, unspecified: Secondary | ICD-10-CM

## 2011-09-20 DIAGNOSIS — Z01419 Encounter for gynecological examination (general) (routine) without abnormal findings: Secondary | ICD-10-CM

## 2011-09-20 DIAGNOSIS — R102 Pelvic and perineal pain unspecified side: Secondary | ICD-10-CM | POA: Insufficient documentation

## 2011-09-20 DIAGNOSIS — IMO0002 Reserved for concepts with insufficient information to code with codable children: Secondary | ICD-10-CM

## 2011-09-20 DIAGNOSIS — R1032 Left lower quadrant pain: Secondary | ICD-10-CM

## 2011-09-20 DIAGNOSIS — K432 Incisional hernia without obstruction or gangrene: Secondary | ICD-10-CM

## 2011-09-20 DIAGNOSIS — N949 Unspecified condition associated with female genital organs and menstrual cycle: Secondary | ICD-10-CM

## 2011-09-20 MED ORDER — HYDROCODONE-ACETAMINOPHEN 2.5-500 MG PO TABS: 1.0000 | ORAL_TABLET | Freq: Four times a day (QID) | ORAL | Status: AC | PRN

## 2011-09-20 NOTE — Patient Instructions (Addendum)
Hernia  A hernia occurs when an internal organ pushes out through a weak spot in the abdominal wall. Hernias most commonly occur in the groin and around the navel. Hernias often can be pushed back into place (reduced). Most hernias tend to get worse over time. Some abdominal hernias can get stuck in the opening (irreducible or incarcerated hernia) and cannot be reduced. An irreducible abdominal hernia which is tightly squeezed into the opening is at risk for impaired blood supply (strangulated hernia). A strangulated hernia is a medical emergency. Because of the risk for an irreducible or strangulated hernia, surgery may be recommended to repair a hernia.  CAUSES    Heavy lifting.   Prolonged coughing.   Straining to have a bowel movement.   A cut (incision) made during an abdominal surgery.  HOME CARE INSTRUCTIONS    Bed rest is not required. You may continue your normal activities.   Avoid lifting more than 10 pounds (4.5 kg) or straining.   Cough gently. If you are a smoker it is best to stop. Even the best hernia repair can break down with the continual strain of coughing. Even if you do not have your hernia repaired, a cough will continue to aggravate the problem.   Do not wear anything tight over your hernia. Do not try to keep it in with an outside bandage or truss. These can damage abdominal contents if they are trapped within the hernia sac.   Eat a normal diet.   Avoid constipation. Straining over long periods of time will increase hernia size and encourage breakdown of repairs. If you cannot do this with diet alone, stool softeners may be used.  SEEK IMMEDIATE MEDICAL CARE IF:    You have a fever.   You develop increasing abdominal pain.   You feel nauseous or vomit.   Your hernia is stuck outside the abdomen, looks discolored, feels hard, or is tender.   You have any changes in your bowel habits or in the hernia that are unusual for you.   You have increased pain or swelling around the  hernia.   You cannot push the hernia back in place by applying gentle pressure while lying down.  MAKE SURE YOU:    Understand these instructions.   Will watch your condition.   Will get help right away if you are not doing well or get worse.  Document Released: 03/25/2005 Document Revised: 03/14/2011 Document Reviewed: 11/12/2007  [employer] Patient Information 2012 [employer].

## 2011-09-20 NOTE — Progress Notes (Signed)
Tammy Mathis 07-18-1980 [id number]   History:    31 y.o.  for annual gyn exam who continues to complaint of left lower quadrant discomfort. She states the discomfort is on daily basis. Review of her record indicated that on January 16 of this year patient underwent a diagnostic laparoscopy along with left ovarian cystectomy for left ovarian cyst and pelvic pain. No endometriosis was seen at time of the laparoscopy. Pathology report as follows:  Ovary, cyst, left - BENIGN FOLLICULAR CYST AND BLOOD CLOT. - NO ENDOMETRIOSIS OR EVIDENCE OF MALIGNANCY.  Patient had a C-section many years ago. She states at times she has felt a slight bulge on her left lateral aspect of her Pfannenstiel incision. She denies any GU or GI complaints. She is on a continuous oral contraceptive pill and is having normal menstrual cycles. Patient does her monthly self breast examination. She's a chronic smoker and is working on this continuing this habit.  An ultrasound was done today with the following: Uterus measures 7.6 x 4.7 x 4.7 cm with endometrial stripe of 10 mm (day 16). Right ovary normal, left ovary simple ovarian follicle measuring 2.1 x 2.1 cm. Trace amount of free fluid was seen in the cul-de-sac.  Past medical history,surgical history, family history and social history were all reviewed and documented in the EPIC chart.  Gynecologic History Patient's last menstrual period was 08/05/2011. Contraception: OCP (estrogen/progesterone) Last Pap: 2012. Results were: normal Last mammogram: Not indicated. Results were: Not indicated  Obstetric History OB History    Grav Para Term Preterm Abortions TAB SAB Ect Mult Living   1 1 1       1      # Outc Date GA Lbr Len/2nd Wgt Sex Del Anes PTL Lv   1 TRM     F CS  No Yes       ROS: A ROS was performed and pertinent positives and negatives are included in the history.  GENERAL: No fevers or chills. HEENT: No change in vision, no earache, sore throat or sinus  congestion. NECK: No pain or stiffness. CARDIOVASCULAR: No chest pain or pressure. No palpitations. PULMONARY: No shortness of breath, cough or wheeze. GASTROINTESTINAL: No abdominal pain, nausea, vomiting or diarrhea, melena or bright red blood per rectum. GENITOURINARY: No urinary frequency, urgency, hesitancy or dysuria. MUSCULOSKELETAL: No joint or muscle pain, no back pain, no recent trauma. DERMATOLOGIC: No rash, no itching, no lesions. ENDOCRINE: No polyuria, polydipsia, no heat or cold intolerance. No recent change in weight. HEMATOLOGICAL: No anemia or easy bruising or bleeding. NEUROLOGIC: No headache, seizures, numbness, tingling or weakness. PSYCHIATRIC: No depression, no loss of interest in normal activity or change in sleep pattern.     Exam: chaperone present  BP 108/72  Ht 5' 5.5" (1.664 m)  Wt 135 lb (61.236 kg)  BMI 22.12 kg/m2  LMP 08/05/2011  Body mass index is 22.12 kg/(m^2).  General appearance : Well developed well nourished female. No acute distress HEENT: Neck supple, trachea midline, no carotid bruits, no thyroidmegaly Lungs: Clear to auscultation, no rhonchi or wheezes, or rib retractions  Heart: Regular rate and rhythm, no murmurs or gallops Breast:Examined in sitting and supine position were symmetrical in appearance, no palpable masses or tenderness,  no skin retraction, no nipple inversion, no nipple discharge, no skin discoloration, no axillary or supraclavicular lymphadenopathy Abdomen: Patient was examined in the supine and erect position for possible hernia. The left lateral portion of her Pfannenstiel incision was tender and a questionable  small defect was evident. No protrusion of tissue was evident. This examination elicited significant amount of pain and patient stated that this is for her pain as mostly coming from. No evidence of femoral or inguinal hernia Extremities: no edema or skin discoloration or tenderness  Pelvic:  Bartholin, Urethra, Skene  Glands: Within normal limits             Vagina: No gross lesions or discharge  Cervix: No gross lesions or discharge  Uterus  retroverted, normal size, shape and consistency, non-tender and mobile  Adnexa  Without masses or tenderness  Anus and perineum  normal   Rectovaginal  normal sphincter tone without palpated masses or tenderness             Hemoccult not done     Assessment/Plan:  31 y.o. female for annual exam with persistent left lower quadrant pain it appears to be attributed to a possible incisional hernia from previous Pfannenstiel incision at time of C-section. Patient will be referred to my general surgery colleagues for further evaluation and treatment. Patient's primary physician has done her lab work recently and no additional labs will be drawn today. We discussed the new Pap smear screening guidelines and she will not need a Pap smear for 2 more years. She was counseled on smoking and breast exams. We'll wait for the evaluation from the general surgeon before any further GYN intervention is undertaken such as   RE -laparoscoping  and possible left salpingo-oophorectomy. Before we would get to that stage I would have her evaluated by the gastroenterologist as well.. All the above was discussed with the patient and literature information on hernia as was provided as well.    Reynaldo Minium H MD, 1:25 PM 09/20/2011

## 2011-09-20 NOTE — Addendum Note (Signed)
Addended by: Bertram Savin A on: 09/20/2011 02:40 PM   Modules accepted: Orders

## 2011-09-24 ENCOUNTER — Other Ambulatory Visit: Payer: Self-pay | Admitting: *Deleted

## 2011-09-24 ENCOUNTER — Telehealth: Payer: Self-pay | Admitting: *Deleted

## 2011-09-24 DIAGNOSIS — K432 Incisional hernia without obstruction or gangrene: Secondary | ICD-10-CM

## 2011-09-24 NOTE — Telephone Encounter (Signed)
Message copied by Libby Maw on Tue Sep 24, 2011 10:40 AM ------      Message from: Ok Edwards      Created: Fri Sep 20, 2011  1:38 PM       Abcde Oneil, please schedule an appointment for this patient with Dr. Ovidio Kin general surgeon for this patient with an incisional hernia. Patient would prefer morning appointment. Thank you

## 2011-09-24 NOTE — Telephone Encounter (Signed)
Patient informed appt set up with Dr. Abbey Chatters on 10/23/11 @ 8:50am.

## 2011-10-23 ENCOUNTER — Encounter (INDEPENDENT_AMBULATORY_CARE_PROVIDER_SITE_OTHER): Payer: BC Managed Care – PPO | Admitting: General Surgery

## 2011-12-05 ENCOUNTER — Encounter (INDEPENDENT_AMBULATORY_CARE_PROVIDER_SITE_OTHER): Payer: BC Managed Care – PPO | Admitting: General Surgery

## 2011-12-12 ENCOUNTER — Encounter (INDEPENDENT_AMBULATORY_CARE_PROVIDER_SITE_OTHER): Payer: BC Managed Care – PPO | Admitting: Surgery

## 2012-12-14 ENCOUNTER — Encounter (HOSPITAL_COMMUNITY): Payer: Self-pay | Admitting: Emergency Medicine

## 2012-12-14 ENCOUNTER — Emergency Department (HOSPITAL_COMMUNITY)
Admission: EM | Admit: 2012-12-14 | Discharge: 2012-12-14 | Disposition: A | Discharge status: Home / Self Care | Payer: BC Managed Care – PPO | Attending: Emergency Medicine | Admitting: Emergency Medicine

## 2012-12-14 ENCOUNTER — Emergency Department (HOSPITAL_COMMUNITY): Payer: BC Managed Care – PPO

## 2012-12-14 DIAGNOSIS — F411 Generalized anxiety disorder: Secondary | ICD-10-CM | POA: Insufficient documentation

## 2012-12-14 DIAGNOSIS — R269 Unspecified abnormalities of gait and mobility: Secondary | ICD-10-CM | POA: Insufficient documentation

## 2012-12-14 DIAGNOSIS — Z8742 Personal history of other diseases of the female genital tract: Secondary | ICD-10-CM | POA: Insufficient documentation

## 2012-12-14 DIAGNOSIS — M5416 Radiculopathy, lumbar region: Secondary | ICD-10-CM

## 2012-12-14 DIAGNOSIS — F17200 Nicotine dependence, unspecified, uncomplicated: Secondary | ICD-10-CM | POA: Insufficient documentation

## 2012-12-14 DIAGNOSIS — Z9889 Other specified postprocedural states: Secondary | ICD-10-CM | POA: Insufficient documentation

## 2012-12-14 DIAGNOSIS — R5381 Other malaise: Secondary | ICD-10-CM | POA: Insufficient documentation

## 2012-12-14 DIAGNOSIS — M545 Low back pain, unspecified: Secondary | ICD-10-CM | POA: Insufficient documentation

## 2012-12-14 DIAGNOSIS — IMO0002 Reserved for concepts with insufficient information to code with codable children: Secondary | ICD-10-CM | POA: Insufficient documentation

## 2012-12-14 DIAGNOSIS — J45909 Unspecified asthma, uncomplicated: Secondary | ICD-10-CM | POA: Insufficient documentation

## 2012-12-14 DIAGNOSIS — Z862 Personal history of diseases of the blood and blood-forming organs and certain disorders involving the immune mechanism: Secondary | ICD-10-CM | POA: Insufficient documentation

## 2012-12-14 DIAGNOSIS — R300 Dysuria: Secondary | ICD-10-CM | POA: Insufficient documentation

## 2012-12-14 MED ORDER — HYDROMORPHONE HCL PF 1 MG/ML IJ SOLN
1.0000 mg | INTRAMUSCULAR | Status: DC | PRN
  Administered 2012-12-14 (×2): 1 mg via INTRAVENOUS
  Filled 2012-12-14 (×2): qty 1

## 2012-12-14 NOTE — ED Provider Notes (Signed)
I saw and evaluated the patient, reviewed the resident's note and I agree with the findings and plan.   .Face to face Exam:  General:  Awake HEENT:  Atraumatic Resp:  Normal effort Abd:  Nondistended Neuro:No focal weakness  Nelia Shi, MD 12/14/12 [phone]

## 2012-12-14 NOTE — ED Notes (Signed)
Pt reports lower back pain for the past week. Pt states that she was moving boxes a week ago and may have injured her back then. Pt states that this morning she had an episode of urinary incontinence. Pt states that she has been able to urinate normally since then. Pt reports that she is able to ambulate, but it is painful. Pt reports history of sciatica. Pt states now she has tingling and numbness to both legs, but worse in L leg. Pt arrives in wheelchair to exam room. Pt states she has a ride home.

## 2012-12-14 NOTE — ED Provider Notes (Signed)
CSN: [id number]     Arrival date & time 12/14/12  1554 History   None    Chief Complaint  Patient presents with  . Back Pain   HPI Comments: Pt is a 32 y/o female w/ PMHx of anxiety, ovarian cysts, who presents to the ED with worsening low back pain x 1 week now.  Pt originally was moving furniture around 7-8 days ago and had some generalized low back pain at that time, did not feel pop in her low back or have sciatic type Sx, but had her pain progressively worsen.  She went to [facility] ED on 9/2 at which point she was dx with a lumbar back strain and sent home with flexeril/norco.  She took these medications as prescribed with minimal relief, started having some radicular pain B/L LE around 3-4 days ago, and went back to [facility] ED yesterday at which point she was given more norco/flexeril and a medro-dose pack.  Pt continued to have pain, radicular Sx this AM, at which point she states she had bladder incontinence when she was awake and decided to come back to the ED.  She denies any weight loss, midline bony tenderness, fever, night sweats, chills, bowel incontinence.  Does report some trouble with walking, especially left foot drop, over the past 1-2 days.   The history is provided by the patient.    Past Medical History  Diagnosis Date  . Smoker     5 CIGS A DAY  . Endometriosis   . Ovarian cyst   . Cervicitis   . Asthma     as child, no prob as adult, no inhaler  . Anemia     history with pregnancy, no current prob  . Anxiety     no meds  . History of ovarian cystectomy    Past Surgical History  Procedure Laterality Date  . Cervical biopsy  w/ loop electrode excision  02/2000  . Cesarean section  02/2001  . Wisdom tooth extraction    . Laparoscopy  04/24/2011    Procedure: LAPAROSCOPY OPERATIVE;  Surgeon: Ok Edwards, MD;  Location: [facility];  Service: Gynecology;  Laterality: N/A;  pelvic washings  . Ovarian cyst removal  04/24/2011    Procedure: OVARIAN CYSTECTOMY;  Surgeon: Ok Edwards, MD;  Location: [facility];  Service: Gynecology;  Laterality: Left;   Family History  Problem Relation Age of Onset  . Adopted: Yes  . Hypertension Father   . Cancer Maternal Grandmother     OVARIAN  . Breast cancer Maternal Grandmother   . Heart disease Maternal Grandmother   . Heart disease Maternal Grandfather   . Heart disease Paternal Grandmother   . Heart disease Paternal Grandfather    History  Substance Use Topics  . Smoking status: Current Every Day Smoker -- 0.25 packs/day for 12 years    Types: Cigarettes  . Smokeless tobacco: Never Used  . Alcohol Use: No   OB History   Grav Para Term Preterm Abortions TAB SAB Ect Mult Living   1 1 1       1      Review of Systems  HENT: Negative.   Eyes: Negative.   Respiratory: Negative.   Cardiovascular: Negative.   Gastrointestinal: Negative.   Genitourinary: Positive for difficulty urinating. Negative for dysuria and flank pain.  Musculoskeletal: Positive for back pain and gait problem. Negative for myalgias, joint swelling and arthralgias.  Skin: Negative.   Allergic/Immunologic: Negative.   Neurological: Positive for  weakness. Negative for dizziness, tremors, seizures, syncope, facial asymmetry, speech difficulty, light-headedness, numbness and headaches.  Hematological: Negative.     Allergies  Benzoyl peroxide  Home Medications   Current Outpatient Rx  Name  Route  Sig  Dispense  Refill  . cyclobenzaprine (FLEXERIL) 10 MG tablet   Oral   Take 10 mg by mouth 3 (three) times daily as needed for muscle spasms (muscle spasm).         Marland Kitchen ibuprofen (ADVIL,MOTRIN) 200 MG tablet   Oral   Take 800 mg by mouth every 8 (eight) hours as needed. For         . naproxen (NAPROSYN) 250 MG tablet   Oral   Take 375 mg by mouth 2 (two) times daily as needed (pain).         . traMADol (ULTRAM) 50 MG tablet   Oral   Take 50 mg by mouth every 6 (six) hours as needed for pain (pain).          BP 126/77   Pulse 74  Temp(Src) 98.2 F (36.8 C) (Oral)  Resp 20  SpO2 99% Physical Exam  Constitutional: She is oriented to person, place, and time. She appears well-developed and well-nourished. She appears distressed.  HENT:  Head: Normocephalic and atraumatic.  Eyes: EOM are normal. Pupils are equal, round, and reactive to light.  Neck: Normal range of motion. Neck supple.  Cardiovascular: Normal rate and regular rhythm.   No murmur heard. Pulmonary/Chest: Effort normal and breath sounds normal.  Abdominal: Soft. Bowel sounds are normal.  Musculoskeletal:       Lumbar back: She exhibits decreased range of motion, tenderness, bony tenderness, pain and spasm. She exhibits no swelling, no edema, no deformity, no laceration and normal pulse.  Lumbar Spine - No gross deformities, erythema, edema.  TTP - PSIS B/L, Midline Lumbar spine, L paraspinal musculature (hypertonic), ROM - 20 degrees flexion, 0 degrees extension, MS - Hip flexion B/L 4/5, Hip extension 5/5 B/L, Knee flexion 4/5 B/L, Knee Extension 3/5 L/ 4/5 R, Dorsiflexion 4/5 R, 3/5 L, Plantarflexion 3/5 L, 4/5 R.  Straight Leg + 20-30 degrees B/L.   Neurological: She is alert and oriented to person, place, and time. No cranial nerve deficit or sensory deficit. She displays no Babinski's sign on the right side. She displays no Babinski's sign on the left side.  Reflex Scores:      Patellar reflexes are 2+ on the right side and 2+ on the left side.      Achilles reflexes are 2+ on the right side and 2+ on the left side. Skin: She is not diaphoretic.  Psychiatric: Her speech is normal. Thought content normal. Her mood appears anxious.    ED Course  Procedures (including critical care time) Labs Review Labs Reviewed - No data to display Imaging Review No results found.  MDM   1. Lumbar back pain   2. Radiculopathy of lumbar region   Pt w/ concerning red flag for bladder incontinence today and worsening weakness.  Will get MRI to evaluate  for acute process.  Dilaudid 2mg  given for pain and anxiety prior to MRI.   7:21 PM - Reviewed MRI which is completely normal.  Reviewed pt's controlled substance record through the database which shows multiple refills over the past 4-5 months at different pharmacies.  With regards to her MRI, will recommend NSAIDS, rest, muscle relaxer (which she has from [facility]), and to f/u with her PCP.   Judie Grieve  Archie Patten, DO of [facility] 12/14/2012, 7:23 PM     Briscoe Deutscher, DO 12/14/12 [id number]

## 2013-01-27 ENCOUNTER — Emergency Department (HOSPITAL_COMMUNITY)
Admission: EM | Admit: 2013-01-27 | Discharge: 2013-01-27 | Disposition: A | Discharge status: Home / Self Care | Payer: Self-pay | Attending: Emergency Medicine | Admitting: Emergency Medicine

## 2013-01-27 ENCOUNTER — Encounter (HOSPITAL_COMMUNITY): Payer: Self-pay | Admitting: Emergency Medicine

## 2013-01-27 ENCOUNTER — Emergency Department (HOSPITAL_COMMUNITY): Payer: BC Managed Care – PPO

## 2013-01-27 DIAGNOSIS — R110 Nausea: Secondary | ICD-10-CM | POA: Insufficient documentation

## 2013-01-27 DIAGNOSIS — Z9889 Other specified postprocedural states: Secondary | ICD-10-CM | POA: Insufficient documentation

## 2013-01-27 DIAGNOSIS — N83209 Unspecified ovarian cyst, unspecified side: Secondary | ICD-10-CM | POA: Insufficient documentation

## 2013-01-27 DIAGNOSIS — F17200 Nicotine dependence, unspecified, uncomplicated: Secondary | ICD-10-CM | POA: Insufficient documentation

## 2013-01-27 DIAGNOSIS — Z862 Personal history of diseases of the blood and blood-forming organs and certain disorders involving the immune mechanism: Secondary | ICD-10-CM | POA: Insufficient documentation

## 2013-01-27 DIAGNOSIS — Z8659 Personal history of other mental and behavioral disorders: Secondary | ICD-10-CM | POA: Insufficient documentation

## 2013-01-27 DIAGNOSIS — J45909 Unspecified asthma, uncomplicated: Secondary | ICD-10-CM | POA: Insufficient documentation

## 2013-01-27 DIAGNOSIS — Z3202 Encounter for pregnancy test, result negative: Secondary | ICD-10-CM | POA: Insufficient documentation

## 2013-01-27 LAB — URINALYSIS, ROUTINE W REFLEX MICROSCOPIC
Bilirubin Urine: NEGATIVE
Glucose, UA: NEGATIVE mg/dL
Hgb urine dipstick: NEGATIVE
Protein, ur: NEGATIVE mg/dL
Specific Gravity, Urine: 1.026 (ref 1.005–1.030)
Urobilinogen, UA: 0.2 mg/dL (ref 0.0–1.0)

## 2013-01-27 LAB — URINE MICROSCOPIC-ADD ON

## 2013-01-27 LAB — WET PREP, GENITAL
Clue Cells Wet Prep HPF POC: NONE SEEN
Trich, Wet Prep: NONE SEEN
WBC, Wet Prep HPF POC: NONE SEEN

## 2013-01-27 LAB — PREGNANCY, URINE: Preg Test, Ur: NEGATIVE

## 2013-01-27 LAB — CBC WITH DIFFERENTIAL/PLATELET
Basophils Absolute: 0 10*3/uL (ref 0.0–0.1)
Basophils Relative: 0 % (ref 0–1)
HCT: 36.5 % (ref 36.0–46.0)
Lymphocytes Relative: 25 % (ref 12–46)
MCHC: 34.2 g/dL (ref 30.0–36.0)
Neutro Abs: 7.6 10*3/uL (ref 1.7–7.7)
Neutrophils Relative %: 68 % (ref 43–77)
Platelets: 238 10*3/uL (ref 150–400)
RDW: 13.6 % (ref 11.5–15.5)
WBC: 11.3 10*3/uL — ABNORMAL HIGH (ref 4.0–10.5)

## 2013-01-27 LAB — COMPREHENSIVE METABOLIC PANEL
ALT: 8 U/L (ref 0–35)
AST: 14 U/L (ref 0–37)
Albumin: 4 g/dL (ref 3.5–5.2)
CO2: 24 mEq/L (ref 19–32)
Chloride: 106 mEq/L (ref 96–112)
Creatinine, Ser: 0.66 mg/dL (ref 0.50–1.10)
GFR calc non Af Amer: >90 mL/min (ref >90)
Sodium: 138 mEq/L (ref 135–145)
Total Bilirubin: 0.1 mg/dL — ABNORMAL LOW (ref 0.3–1.2)

## 2013-01-27 MED ORDER — SODIUM CHLORIDE 0.9 % IV BOLUS (SEPSIS)
1000.0000 mL | Freq: Once | INTRAVENOUS | Status: AC
  Administered 2013-01-27: 1000 mL via INTRAVENOUS

## 2013-01-27 MED ORDER — ONDANSETRON HCL 4 MG/2ML IJ SOLN
4.0000 mg | INTRAMUSCULAR | Status: AC
  Administered 2013-01-27: 4 mg via INTRAVENOUS
  Filled 2013-01-27: qty 2

## 2013-01-27 MED ORDER — OXYCODONE-ACETAMINOPHEN 5-325 MG PO TABS: 1.0000 | ORAL_TABLET | Freq: Four times a day (QID) | ORAL | Status: DC | PRN

## 2013-01-27 MED ORDER — KETOROLAC TROMETHAMINE 30 MG/ML IJ SOLN
30.0000 mg | Freq: Once | INTRAMUSCULAR | Status: AC
  Administered 2013-01-27: 30 mg via INTRAVENOUS
  Filled 2013-01-27: qty 1

## 2013-01-27 MED ORDER — MORPHINE SULFATE 4 MG/ML IJ SOLN
4.0000 mg | Freq: Once | INTRAMUSCULAR | Status: AC
  Administered 2013-01-27: 4 mg via INTRAVENOUS
  Filled 2013-01-27: qty 1

## 2013-01-27 MED ORDER — ONDANSETRON HCL 4 MG PO TABS: 4.0000 mg | ORAL_TABLET | Freq: Four times a day (QID) | ORAL | Status: DC

## 2013-01-27 MED ORDER — HYDROMORPHONE HCL PF 1 MG/ML IJ SOLN
1.0000 mg | Freq: Once | INTRAMUSCULAR | Status: AC
  Administered 2013-01-27: 1 mg via INTRAVENOUS
  Filled 2013-01-27: qty 1

## 2013-01-27 NOTE — ED Notes (Signed)
Pt in c/o left lower quad pain that started suddenly this morning, states she has a history of ovarian cysts and this feels similar, states she had a recent ultrasound that showed three cysts to her left side, also nausea

## 2013-01-27 NOTE — ED Provider Notes (Signed)
CSN: [id number]     Arrival date & time 01/27/13  1325 History   First MD Initiated Contact with Patient 01/27/13 1440     Chief Complaint  Patient presents with  . Abdominal Pain   (Consider location/radiation/quality/duration/timing/severity/associated sxs/prior Treatment) HPI Comments: Patient is a 32 year old female with a history of endometriosis, ovarian cysts, and cervicitis who presents for L suprapubic abdominal pain x 3 days, worsening x 24 hours. Patient states the pain began as a cramping pain and started to become more sharp in nature yesterday. Patient denies any radiation of her pain as well as any alleviating factors. Pain is worse with palpation and movement. She states that she has a history of cysts and this pain feels similar to past ruptured cysts. Patient endorses associated nausea without emesis and denies associated fever, chest pain or shortness of breath, vomiting, diarrhea, melena or hematochezia, dysuria or hematuria, vaginal bleeding or discharge, and numbness or tingling. LMP 01/03/2013. Patient endorses unprotected sexual intercourse with one partner for 1.5 years. She states they have both been tested negative for STDs recently.  OBGYN - Dr. Reynaldo Minium  The history is provided by the patient. No language interpreter was used.    Past Medical History  Diagnosis Date  . Smoker     5 CIGS A DAY  . Endometriosis   . Ovarian cyst   . Cervicitis   . Asthma     as child, no prob as adult, no inhaler  . Anemia     history with pregnancy, no current prob  . Anxiety     no meds  . History of ovarian cystectomy    Past Surgical History  Procedure Laterality Date  . Cervical biopsy  w/ loop electrode excision  02/2000  . Cesarean section  02/2001  . Wisdom tooth extraction    . Laparoscopy  04/24/2011    Procedure: LAPAROSCOPY OPERATIVE;  Surgeon: Ok Edwards, MD;  Location: [facility];  Service: Gynecology;  Laterality: N/A;  pelvic washings  . Ovarian  cyst removal  04/24/2011    Procedure: OVARIAN CYSTECTOMY;  Surgeon: Ok Edwards, MD;  Location: [facility];  Service: Gynecology;  Laterality: Left;   Family History  Problem Relation Age of Onset  . Adopted: Yes  . Hypertension Father   . Cancer Maternal Grandmother     OVARIAN  . Breast cancer Maternal Grandmother   . Heart disease Maternal Grandmother   . Heart disease Maternal Grandfather   . Heart disease Paternal Grandmother   . Heart disease Paternal Grandfather    History  Substance Use Topics  . Smoking status: Current Every Day Smoker -- 0.25 packs/day for 12 years    Types: Cigarettes  . Smokeless tobacco: Never Used  . Alcohol Use: No   OB History   Grav Para Term Preterm Abortions TAB SAB Ect Mult Living   1 1 1       1      Review of Systems  Constitutional: Negative for fever.  Respiratory: Negative for shortness of breath.   Cardiovascular: Negative for chest pain.  Gastrointestinal: Positive for nausea.  Genitourinary: Negative for dysuria, hematuria, vaginal bleeding and vaginal discharge.       L suprapubic pain  All other systems reviewed and are negative.    Allergies  Benzoyl peroxide and Other  Home Medications   Current Outpatient Rx  Name  Route  Sig  Dispense  Refill  . ibuprofen (ADVIL,MOTRIN) 200 MG tablet   Oral  Take 800 mg by mouth every 8 (eight) hours as needed. For         . promethazine (PHENERGAN) 25 MG tablet   Oral   Take 25 mg by mouth every 6 (six) hours as needed.         . ondansetron (ZOFRAN) 4 MG tablet   Oral   Take 1 tablet (4 mg total) by mouth every 6 (six) hours.   12 tablet   0   . oxyCODONE-acetaminophen (PERCOCET/ROXICET) 5-325 MG per tablet   Oral   Take 1-2 tablets by mouth every 6 (six) hours as needed for pain.   17 tablet   0    BP 108/62  Pulse 81  Temp(Src) 98.2 F (36.8 C) (Oral)  Resp 18  Wt 130 lb (58.968 kg)  BMI 21.3 kg/m2  SpO2 100%  Physical Exam  Nursing note and vitals  reviewed. Constitutional: She is oriented to person, place, and time. She appears well-developed and well-nourished. No distress.  HENT:  Head: Normocephalic and atraumatic.  Mouth/Throat: Oropharynx is clear and moist. No oropharyngeal exudate.  Eyes: Conjunctivae and EOM are normal. Pupils are equal, round, and reactive to light. No scleral icterus.  Neck: Normal range of motion.  Cardiovascular: Normal rate, regular rhythm and normal heart sounds.   Pulmonary/Chest: Effort normal and breath sounds normal. No respiratory distress. She has no wheezes. She has no rales.  Abdominal: Soft. She exhibits no distension and no mass. There is tenderness (L suprapubic). There is no rebound and no guarding.  No peritoneal signs or involuntary guarding  Genitourinary: There is no rash, tenderness, lesion or injury on the right labia. There is no rash, tenderness, lesion or injury on the left labia. Uterus is tender. Cervix exhibits no motion tenderness, no discharge and no friability. Right adnexum displays no mass, no tenderness and no fullness. Left adnexum displays tenderness. Left adnexum displays no mass and no fullness. No erythema or tenderness around the vagina. No foreign body around the vagina. No signs of injury around the vagina. Vaginal discharge (white milky) found.  Musculoskeletal: Normal range of motion.  Neurological: She is alert and oriented to person, place, and time.  Skin: Skin is warm and dry. No rash noted. She is not diaphoretic. No erythema. No pallor.  Psychiatric: She has a normal mood and affect. Her behavior is normal.    ED Course  Procedures (including critical care time) Labs Review Labs Reviewed  CBC WITH DIFFERENTIAL - Abnormal; Notable for the following:    WBC 11.3 (*)    All other components within normal limits  COMPREHENSIVE METABOLIC PANEL - Abnormal; Notable for the following:    Total Bilirubin 0.1 (*)    All other components within normal limits   URINALYSIS, ROUTINE W REFLEX MICROSCOPIC - Abnormal; Notable for the following:    APPearance CLOUDY (*)    Nitrite POSITIVE (*)    All other components within normal limits  URINE MICROSCOPIC-ADD ON - Abnormal; Notable for the following:    Squamous Epithelial / LPF MANY (*)    Bacteria, UA MANY (*)    All other components within normal limits  WET PREP, GENITAL  GC/CHLAMYDIA PROBE AMP  PREGNANCY, URINE   Imaging Review US Transvaginal Non-ob  01/27/2013   CLINICAL DATA:  Abdominal pain  EXAM: TRANSABDOMINAL AND TRANSVAGINAL ULTRASOUND OF PELVIS  DOPPLER ULTRASOUND OF OVARIES  TECHNIQUE: Both transabdominal and transvaginal ultrasound examinations of the pelvis were performed. Transabdominal technique was performed for global  imaging of the pelvis including uterus, ovaries, adnexal regions, and pelvic cul-de-sac.  It was necessary to proceed with endovaginal exam following the transabdominal exam to visualize the right ovary. Color and duplex Doppler ultrasound was utilized to evaluate blood flow to the ovaries.  COMPARISON:  10/28/2010  FINDINGS: Uterus  Measurements: 6.9 x 5.2 x 4.5 cm. Retroverted. No fibroids or other mass visualized.  Endometrium  Thickness: 10 mm.  No focal abnormality visualized.  Right ovary  Measurements: 3.5 x 2.8 x 2.2 cm. Dominant 20 x 14 x 20 mm complex/hemorrhagic cyst. Associated adjacent tubular structure, possibly reflecting a hydrosalpinx.  Left ovary  Measurements: 2.8 x 1.8 x 1.9 cm. Associated 13 x 11 x 8 mm complex/hemorrhagic cyst.  Pulsed Doppler evaluation of both ovaries demonstrates normal low-resistance arterial and venous waveforms.  Other findings  Small volume pelvic ascites.  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  No sonographic evidence for ovarian torsion.   Electronically Signed   By: Charline Bills M.D.   On: 01/27/2013 16:25   US Pelvis Complete  01/27/2013   CLINICAL DATA:   Abdominal pain  EXAM: TRANSABDOMINAL AND TRANSVAGINAL ULTRASOUND OF PELVIS  DOPPLER ULTRASOUND OF OVARIES  TECHNIQUE: Both transabdominal and transvaginal ultrasound examinations of the pelvis were performed. Transabdominal technique was performed for global imaging of the pelvis including uterus, ovaries, adnexal regions, and pelvic cul-de-sac.  It was necessary to proceed with endovaginal exam following the transabdominal exam to visualize the right ovary. Color and duplex Doppler ultrasound was utilized to evaluate blood flow to the ovaries.  COMPARISON:  10/28/2010  FINDINGS: Uterus  Measurements: 6.9 x 5.2 x 4.5 cm. Retroverted. No fibroids or other mass visualized.  Endometrium  Thickness: 10 mm.  No focal abnormality visualized.  Right ovary  Measurements: 3.5 x 2.8 x 2.2 cm. Dominant 20 x 14 x 20 mm complex/hemorrhagic cyst. Associated adjacent tubular structure, possibly reflecting a hydrosalpinx.  Left ovary  Measurements: 2.8 x 1.8 x 1.9 cm. Associated 13 x 11 x 8 mm complex/hemorrhagic cyst.  Pulsed Doppler evaluation of both ovaries demonstrates normal low-resistance arterial and venous waveforms.  Other findings  Small volume pelvic ascites.  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  No sonographic evidence for ovarian torsion.   Electronically Signed   By: Charline Bills M.D.   On: 01/27/2013 16:25   [place] Art/ven Flow Abd Pelv Doppler  01/27/2013   CLINICAL DATA:  Abdominal pain  EXAM: TRANSABDOMINAL AND TRANSVAGINAL ULTRASOUND OF PELVIS  DOPPLER ULTRASOUND OF OVARIES  TECHNIQUE: Both transabdominal and transvaginal ultrasound examinations of the pelvis were performed. Transabdominal technique was performed for global imaging of the pelvis including uterus, ovaries, adnexal regions, and pelvic cul-de-sac.  It was necessary to proceed with endovaginal exam following the transabdominal exam to visualize the right ovary. Color and duplex  Doppler ultrasound was utilized to evaluate blood flow to the ovaries.  COMPARISON:  10/28/2010  FINDINGS: Uterus  Measurements: 6.9 x 5.2 x 4.5 cm. Retroverted. No fibroids or other mass visualized.  Endometrium  Thickness: 10 mm.  No focal abnormality visualized.  Right ovary  Measurements: 3.5 x 2.8 x 2.2 cm. Dominant 20 x 14 x 20 mm complex/hemorrhagic cyst. Associated adjacent tubular structure, possibly reflecting a hydrosalpinx.  Left ovary  Measurements: 2.8 x 1.8 x 1.9 cm. Associated 13 x 11 x 8 mm complex/hemorrhagic cyst.  Pulsed Doppler evaluation of both ovaries demonstrates  normal low-resistance arterial and venous waveforms.  Other findings  Small volume pelvic ascites.  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  No sonographic evidence for ovarian torsion.   Electronically Signed   By: Charline Bills M.D.   On: 01/27/2013 16:25    EKG Interpretation   None       MDM   1. Hemorrhagic ovarian cyst     32 year old female with a history of cervicitis and ovarian cysts presents for suprapubic pain and left adnexal tenderness with onset 3 days ago and worsening x24 hours. Patient is well and nontoxic appearing, hemodynamically stable, and afebrile. Physical exam significant for tenderness in the suprapubic region, L>R, and L adnexal tenderness. Labs today significant for leukocytosis of 11.3. There is no anemia, hemoconcentration, or electrolyte imbalance. Liver and kidney function preserved. Urinalysis, though nitrite positive, is more likely to be consistent with contamination. Wet prep unremarkable and urine pregnancy negative.  Pelvic ultrasound ordered for further evaluation of symptoms which shows complex/hemorrhagic cyst on both ovaries. There is also noted to be and adjacent tubular structure to the right ovary possibly reflecting hydrosalpinx. Patient states she was recently tested for STDs and all of her tests were  negative. Patient also with very little tenderness on the right. Will consult with OBGYN on call for patient's provider to discuss management plan.  Patient states pain has greatly improved since arrival. Have spoke with OBGYN on call for [facility] gynecology who agrees that pain less likely from questionable hydrosalpinx; pain more consistent with hemorrhagic ovarian cysts. Recommend follow up in office in 24-48 hours. Will d/c with percocet and zofran for symptom control. Return precautions discussed and patient agreeable to plan with no unaddressed concerns.  Antony Madura, PA-C 01/27/13 1755

## 2013-01-27 NOTE — ED Notes (Signed)
Verified with lab that urine remains in lab and states will send down reqs for urine.

## 2013-01-27 NOTE — ED Notes (Signed)
Pt alert and mentating appropriately upon d/c. Pt given d/c teaching with follow up care instructions and prescriptions. Pt verbalizes understanding and has no further questions upon d/c. Pt ambulatory with steady gait upon d/c. Pt instructed not to drive. Pt states her mother is coming to get her and endorses that she will not be driving home. Pt leaving with paperwork.

## 2013-01-28 LAB — GC/CHLAMYDIA PROBE AMP: CT Probe RNA: NEGATIVE

## 2013-01-28 NOTE — ED Provider Notes (Signed)
Medical screening examination/treatment/procedure(s) were performed by non-physician practitioner and as supervising physician I was immediately available for consultation/collaboration.  EKG Interpretation   None         Bali Lyn B. Bernette Mayers, MD 01/28/13 [phone]

## 2013-01-29 LAB — URINE CULTURE: Colony Count: >=100000

## 2013-01-30 ENCOUNTER — Telehealth (HOSPITAL_COMMUNITY): Payer: Self-pay | Admitting: Emergency Medicine

## 2013-01-30 NOTE — ED Notes (Signed)
Post ED Visit - Positive Culture Follow-up  Culture report reviewed by antimicrobial stewardship pharmacist: []  Wes Dulaney, Pharm.D., BCPS []  Celedonio Miyamoto, Pharm.D., BCPS []  Georgina Pillion, Pharm.D., BCPS [x]  [place], [address].D., BCPS, AAHIVP []  Estella Husk, Pharm.D., BCPS, AAHIVP  Positive urine culture Per Marissa Sciacca PA-C, no further treatment needed and no further patient follow-up is required at this time.  Kylie A Holland 01/30/2013, 9:22 AM

## 2013-03-02 ENCOUNTER — Telehealth: Payer: Self-pay | Admitting: *Deleted

## 2013-03-02 NOTE — Telephone Encounter (Signed)
Please call a prescription for Ultram 50 mg she may take one every 6 hours as needed for pain #30 no refill

## 2013-03-02 NOTE — Telephone Encounter (Signed)
Pt has annual scheduled on 03/22/13, wont have insurance until Dec. 1st.  C/o ovarian cyst pain left side discomfort, with nausea pt asked if you be willing to give Rx. Please advise

## 2013-03-03 NOTE — Telephone Encounter (Signed)
Left message for pt to call ,

## 2013-03-19 NOTE — Telephone Encounter (Signed)
Left message for pt to call again. 

## 2013-03-22 ENCOUNTER — Encounter: Payer: Self-pay | Admitting: Gynecology

## 2013-03-24 NOTE — Telephone Encounter (Signed)
Pt has appointment on 04/22/13, pt never returned my phone call regarding the below

## 2013-04-22 ENCOUNTER — Encounter: Payer: Self-pay | Admitting: Gynecology

## 2013-04-27 ENCOUNTER — Encounter: Payer: Self-pay | Admitting: Gynecology

## 2013-04-27 ENCOUNTER — Ambulatory Visit (INDEPENDENT_AMBULATORY_CARE_PROVIDER_SITE_OTHER): Payer: Managed Care, Other (non HMO) | Admitting: Gynecology

## 2013-04-27 VITALS — BP 124/76

## 2013-04-27 DIAGNOSIS — N390 Urinary tract infection, site not specified: Secondary | ICD-10-CM

## 2013-04-27 DIAGNOSIS — N949 Unspecified condition associated with female genital organs and menstrual cycle: Secondary | ICD-10-CM

## 2013-04-27 DIAGNOSIS — R102 Pelvic and perineal pain: Secondary | ICD-10-CM

## 2013-04-27 DIAGNOSIS — Z8742 Personal history of other diseases of the female genital tract: Secondary | ICD-10-CM

## 2013-04-27 DIAGNOSIS — K432 Incisional hernia without obstruction or gangrene: Secondary | ICD-10-CM | POA: Insufficient documentation

## 2013-04-27 DIAGNOSIS — IMO0002 Reserved for concepts with insufficient information to code with codable children: Secondary | ICD-10-CM

## 2013-04-27 DIAGNOSIS — Z113 Encounter for screening for infections with a predominantly sexual mode of transmission: Secondary | ICD-10-CM

## 2013-04-27 DIAGNOSIS — N898 Other specified noninflammatory disorders of vagina: Secondary | ICD-10-CM

## 2013-04-27 LAB — WET PREP FOR TRICH, YEAST, CLUE
Trich, Wet Prep: NONE SEEN
WBC WET PREP: NONE SEEN
YEAST WET PREP: NONE SEEN

## 2013-04-27 LAB — URINALYSIS W MICROSCOPIC + REFLEX CULTURE
Bilirubin Urine: NEGATIVE
Casts: NONE SEEN
Crystals: NONE SEEN
Glucose, UA: NEGATIVE mg/dL
Ketones, ur: NEGATIVE mg/dL
LEUKOCYTES UA: NEGATIVE
NITRITE: NEGATIVE
Protein, ur: NEGATIVE mg/dL
RBC / HPF: NONE SEEN RBC/hpf (ref <3)
Specific Gravity, Urine: 1.02 (ref 1.005–1.030)
UROBILINOGEN UA: 0.2 mg/dL (ref 0.0–1.0)
pH: 5.5 (ref 5.0–8.0)

## 2013-04-27 MED ORDER — LEVONORGESTREL-ETHINYL ESTRAD 0.1-20 MG-MCG PO TABS: ORAL_TABLET | ORAL | Status: DC

## 2013-04-27 MED ORDER — NITROFURANTOIN MONOHYD MACRO 100 MG PO CAPS: 100.0000 mg | ORAL_CAPSULE | Freq: Two times a day (BID) | ORAL | Status: DC

## 2013-04-27 MED ORDER — OXYCODONE-ACETAMINOPHEN 5-325 MG PO TABS
1.0000 | ORAL_TABLET | Freq: Four times a day (QID) | ORAL | Status: DC | PRN
Start: 2013-04-27 — End: 2013-04-29

## 2013-04-27 MED ORDER — OFLOXACIN 300 MG PO TABS: 300.0000 mg | ORAL_TABLET | Freq: Two times a day (BID) | ORAL | Status: DC

## 2013-04-27 MED ORDER — FLUCONAZOLE 150 MG PO TABS: 150.0000 mg | ORAL_TABLET | Freq: Once | ORAL | Status: DC

## 2013-04-27 NOTE — Patient Instructions (Addendum)
Urinary Tract Infection Urinary tract infections (UTIs) can develop anywhere along your urinary tract. Your urinary tract is your body's drainage system for removing wastes and extra water. Your urinary tract includes two kidneys, two ureters, a bladder, and a urethra. Your kidneys are a pair of bean-shaped organs. Each kidney is about the size of your fist. They are located below your ribs, one on each side of your spine. CAUSES Infections are caused by microbes, which are microscopic organisms, including fungi, viruses, and bacteria. These organisms are so small that they can only be seen through a microscope. Bacteria are the microbes that most commonly cause UTIs. SYMPTOMS  Symptoms of UTIs may vary by age and gender of the patient and by the location of the infection. Symptoms in young women typically include a frequent and intense urge to urinate and a painful, burning feeling in the bladder or urethra during urination. Older women and men are more likely to be tired, shaky, and weak and have muscle aches and abdominal pain. A fever may mean the infection is in your kidneys. Other symptoms of a kidney infection include pain in your back or sides below the ribs, nausea, and vomiting. DIAGNOSIS To diagnose a UTI, your caregiver will ask you about your symptoms. Your caregiver also will ask to provide a urine sample. The urine sample will be tested for bacteria and white blood cells. White blood cells are made by your body to help fight infection. TREATMENT  Typically, UTIs can be treated with medication. Because most UTIs are caused by a bacterial infection, they usually can be treated with the use of antibiotics. The choice of antibiotic and length of treatment depend on your symptoms and the type of bacteria causing your infection. HOME CARE INSTRUCTIONS  If you were prescribed antibiotics, take them exactly as your caregiver instructs you. Finish the medication even if you feel better after you  have only taken some of the medication.  Drink enough water and fluids to keep your urine clear or pale yellow.  Avoid caffeine, tea, and carbonated beverages. They tend to irritate your bladder.  Empty your bladder often. Avoid holding urine for long periods of time.  Empty your bladder before and after sexual intercourse.  After a bowel movement, women should cleanse from front to back. Use each tissue only once. SEEK MEDICAL CARE IF:   You have back pain.  You develop a fever.  Your symptoms do not begin to resolve within 3 days. SEEK IMMEDIATE MEDICAL CARE IF:   You have severe back pain or lower abdominal pain.  You develop chills.  You have nausea or vomiting.  You have continued burning or discomfort with urination. MAKE SURE YOU:   Understand these instructions.  Will watch your condition.  Will get help right away if you are not doing well or get worse. Document Released: 01/02/2005 Document Revised: 09/24/2011 Document Reviewed: 05/03/2011 [facility] Patient Information 2014 [place]. Bacterial Vaginosis Bacterial vaginosis is a vaginal infection that occurs when the normal balance of bacteria in the vagina is disrupted. It results from an overgrowth of certain bacteria. This is the most common vaginal infection in women of childbearing age. Treatment is important to prevent complications, especially in pregnant women, as it can cause a premature delivery. CAUSES  Bacterial vaginosis is caused by an increase in harmful bacteria that are normally present in smaller amounts in the vagina. Several different kinds of bacteria can cause bacterial vaginosis. However, the reason that the condition develops  is not fully understood. RISK FACTORS Certain activities or behaviors can put you at an increased risk of developing bacterial vaginosis, including:  Having a new sex partner or multiple sex partners.  Douching.  Using an intrauterine device (IUD) for  contraception. Women do not get bacterial vaginosis from toilet seats, bedding, swimming pools, or contact with objects around them. SIGNS AND SYMPTOMS  Some women with bacterial vaginosis have no signs or symptoms. Common symptoms include:  Grey vaginal discharge.  A fishlike odor with discharge, especially after sexual intercourse.  Itching or burning of the vagina and vulva.  Burning or pain with urination. DIAGNOSIS  Your health care provider will take a medical history and examine the vagina for signs of bacterial vaginosis. A sample of vaginal fluid may be taken. Your health care provider will look at this sample under a microscope to check for bacteria and abnormal cells. A vaginal pH test may also be done.  TREATMENT  Bacterial vaginosis may be treated with antibiotic medicines. These may be given in the form of a pill or a vaginal cream. A second round of antibiotics may be prescribed if the condition comes back after treatment.  HOME CARE INSTRUCTIONS   Only take over-the-counter or prescription medicines as directed by your health care provider.  If antibiotic medicine was prescribed, take it as directed. Make sure you finish it even if you start to feel better.  Do not have sex until treatment is completed.  Tell all sexual partners that you have a vaginal infection. They should see their health care provider and be treated if they have problems, such as a mild rash or itching.  Practice safe sex by using condoms and only having one sex partner. SEEK MEDICAL CARE IF:   Your symptoms are not improving after 3 days of treatment.  You have increased discharge or pain.  You have a fever. MAKE SURE YOU:   Understand these instructions.  Will watch your condition.  Will get help right away if you are not doing well or get worse. FOR MORE INFORMATION  Centers for Disease Control and Prevention, Division of STD Prevention: [employer].co.zawww.cdc.gov/std American Sexual Health  Association (ASHA): www.ashastd.org  Document Released: 03/25/2005 Document Revised: 01/13/2013 Document Reviewed: 11/04/2012 [facility] Patient Information 2014 [place], [place]. Hernia A hernia occurs when an internal organ pushes out through a weak spot in the abdominal wall. Hernias most commonly occur in the groin and around the navel. Hernias often can be pushed back into place (reduced). Most hernias tend to get worse over time. Some abdominal hernias can get stuck in the opening (irreducible or incarcerated hernia) and cannot be reduced. An irreducible abdominal hernia which is tightly squeezed into the opening is at risk for impaired blood supply (strangulated hernia). A strangulated hernia is a medical emergency. Because of the risk for an irreducible or strangulated hernia, surgery may be recommended to repair a hernia. CAUSES   Heavy lifting.  Prolonged coughing.  Straining to have a bowel movement.  A cut (incision) made during an abdominal surgery. HOME CARE INSTRUCTIONS   Bed rest is not required. You may continue your normal activities.  Avoid lifting more than 10 pounds (4.5 kg) or straining.  Cough gently. If you are a smoker it is best to stop. Even the best hernia repair can break down with the continual strain of coughing. Even if you do not have your hernia repaired, a cough will continue to aggravate the problem.  Do not wear anything tight  over your hernia. Do not try to keep it in with an outside bandage or truss. These can damage abdominal contents if they are trapped within the hernia sac.  Eat a normal diet.  Avoid constipation. Straining over long periods of time will increase hernia size and encourage breakdown of repairs. If you cannot do this with diet alone, stool softeners may be used. SEEK IMMEDIATE MEDICAL CARE IF:   You have a fever.  You develop increasing abdominal pain.  You feel nauseous or vomit.  Your hernia is stuck outside the abdomen,  looks discolored, feels hard, or is tender.  You have any changes in your bowel habits or in the hernia that are unusual for you.  You have increased pain or swelling around the hernia.  You cannot push the hernia back in place by applying gentle pressure while lying down. MAKE SURE YOU:   Understand these instructions.  Will watch your condition.  Will get help right away if you are not doing well or get worse. Document Released: 03/25/2005 Document Revised: 06/17/2011 Document Reviewed: 11/12/2007 [facility] Patient Information 2014 [place], [place].

## 2013-04-27 NOTE — Progress Notes (Signed)
   Patient is a 33 year old who presented to the office today complaining of lower left lower quadrant pain. Also she was having a vaginal discharge. Patient with past history of ovarian cysts resulting in left ovarian cystectomy in the past. She was seen in the emergency room on November 2014 and had an ultrasound and the aforementioned had a small simple 3 cm ovarian cyst and [place]. She's currently not using any form of contraception had ran out of her oral contraceptive pill. She was having some urinary frequency but no dysuria.  Patient was last seen in the office in June of 2013 and continued to have left lower quadrant discomfort. Patient over 10 years ago had a cesarean section.  Exam: Abdomen tender left lateral Pfannenstiel incision scar site. On Valsalva maneuver she was tender in the defect was palpable which was more prominent when patient was standing and brought tears to her eyes.  Physical Exam  Abdominal:       Pelvic: Bartholin urethra Skene was within normal limits Vagina: Menstrual blood present Cervix: Menstrual blood present Uterus anteverted nontender right adnexa nontender or masses Left adnexa no masses palpated the tenderness was right over the Pfannenstiel scar site Rectal exam: Not done  Urinalysis too numerous to count bacteria 3-6 WBC.  Wet prep Pos Amine many clue cells, too numerous to count bacteria  Assessment/plan: #1 patient's pain and tenderness I do believe is an incisional hernia which is reducible and quite tender on examination. Patient will be referred to the general surgeon for consultation. #2 urinary tract infection #3 bacterial vaginosis #4 contraceptive management. 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 #5 patient will be placed on Floxin 300 mg twice a day for 7 days to cover both UTI and bacterial  vaginosis. Patient will return back to the office in June for a followup ultrasound of the small ovarian cysts seen in the emergency room. I do believe that her pain is to treat her incisional hernia and not such a small ovarian cyst which she has had in the past.

## 2013-04-28 ENCOUNTER — Telehealth: Payer: Self-pay | Admitting: *Deleted

## 2013-04-28 DIAGNOSIS — K432 Incisional hernia without obstruction or gangrene: Secondary | ICD-10-CM

## 2013-04-28 LAB — GC/CHLAMYDIA PROBE AMP
CT PROBE, AMP APTIMA: NEGATIVE
GC PROBE AMP APTIMA: NEGATIVE

## 2013-04-28 MED ORDER — METRONIDAZOLE 0.75 % VA GEL: Freq: Every day | VAGINAL | Status: DC

## 2013-04-28 MED ORDER — NITROFURANTOIN MONOHYD MACRO 100 MG PO CAPS: 100.0000 mg | ORAL_CAPSULE | Freq: Two times a day (BID) | ORAL | Status: DC

## 2013-04-28 NOTE — Telephone Encounter (Signed)
Message copied by Aura CampsWEBB, Shadia L on Wed Apr 28, 2013 11:29 AM ------      Message from: Ok EdwardsFERNANDEZ, JUAN H      Created: Tue Apr 27, 2013 12:45 PM       Please schedule appointment with General surgeon for this patient with incisional hernia and in pain. ------

## 2013-04-28 NOTE — Telephone Encounter (Signed)
Pt informed with the below note. 

## 2013-04-28 NOTE — Telephone Encounter (Signed)
rx sent to pharmacy

## 2013-04-28 NOTE — Telephone Encounter (Signed)
Please call him prescription for Macrobid 1 by mouth twice a day for 7 days. MetroGel vaginal to apply each bedtime for one week

## 2013-04-28 NOTE — Telephone Encounter (Signed)
Pharmacy said that Floxcin 300 mg is not longer available anymore, pt will need another medication. Please advise

## 2013-04-28 NOTE — Telephone Encounter (Signed)
Appointment on 05/10/13 @ 10:20 am with dr. Corliss Skainssuei left message for pt to call.

## 2013-04-29 ENCOUNTER — Telehealth: Payer: Self-pay | Admitting: Gynecology

## 2013-04-29 ENCOUNTER — Other Ambulatory Visit: Payer: Self-pay

## 2013-04-29 ENCOUNTER — Encounter: Payer: Self-pay | Admitting: Gynecology

## 2013-04-29 ENCOUNTER — Telehealth: Payer: Self-pay

## 2013-04-29 DIAGNOSIS — Z8742 Personal history of other diseases of the female genital tract: Secondary | ICD-10-CM

## 2013-04-29 DIAGNOSIS — R102 Pelvic and perineal pain: Secondary | ICD-10-CM

## 2013-04-29 LAB — URINE CULTURE
COLONY COUNT: NO GROWTH
ORGANISM ID, BACTERIA: NO GROWTH

## 2013-04-29 MED ORDER — OXYCODONE-ACETAMINOPHEN 5-325 MG PO TABS: 1.0000 | ORAL_TABLET | Freq: Four times a day (QID) | ORAL | Status: DC | PRN

## 2013-04-29 NOTE — Telephone Encounter (Signed)
Dr. Aida RaiderJF-See patient's note below regarding RX??

## 2013-04-29 NOTE — Telephone Encounter (Signed)
Patient emailed earlier today asking about pain med refill and stating that hernia feels bigger today and was uncomfortable this morning after exam yesterday.  Dr. Glenetta HewJF recommended we try to get her sooner appt and I got her moved earlier to 1/27 10:30 with Dr. Derrell Lollingamirez.  They will put her on cancellation list and call her if sooner opening.  She knows to head to ER if any severe pain, fever or GI sx.  She will pick up Rx tomorrow.

## 2013-04-29 NOTE — Telephone Encounter (Signed)
Did you see her question below about taking this strength along with Motrin and Tylenol.  She asked if different strength or different medication available that might be more effective?

## 2013-04-29 NOTE — Telephone Encounter (Signed)
If she is having so much pain see a general surgeon will see her before February 2. Prescription refill for Percocet 5/325 to take one to 2 tablets every 4-6 hours when necessary #30

## 2013-05-04 ENCOUNTER — Other Ambulatory Visit: Payer: Self-pay | Admitting: Gynecology

## 2013-05-04 ENCOUNTER — Ambulatory Visit (INDEPENDENT_AMBULATORY_CARE_PROVIDER_SITE_OTHER): Payer: BC Managed Care – PPO | Admitting: General Surgery

## 2013-05-04 ENCOUNTER — Telehealth: Payer: Self-pay

## 2013-05-04 DIAGNOSIS — R102 Pelvic and perineal pain: Secondary | ICD-10-CM

## 2013-05-04 DIAGNOSIS — Z8742 Personal history of other diseases of the female genital tract: Secondary | ICD-10-CM

## 2013-05-04 MED ORDER — OXYCODONE-ACETAMINOPHEN 5-325 MG PO TABS: 1.0000 | ORAL_TABLET | Freq: Four times a day (QID) | ORAL | Status: DC | PRN

## 2013-05-04 NOTE — Telephone Encounter (Signed)
Rx at front desk. I emailed her back and warned her that this medication highly addictive and she needs to be taking it sparingly and alternating with Tylenol and/or Advil.

## 2013-05-04 NOTE — Telephone Encounter (Signed)
30 tablets and no refill. F.U with General surgeon.

## 2013-05-04 NOTE — Telephone Encounter (Signed)
Patient emailed requesting refill on her Percocet/Roxicet 5-325.    Her accompanying note said "I had to reschedule my appointment with the surgeon. I had an unexpected charge on my card and was unable to pay the copay. :-(  It is rescheduled for 2/18 in the afternoon."

## 2013-05-10 ENCOUNTER — Ambulatory Visit (INDEPENDENT_AMBULATORY_CARE_PROVIDER_SITE_OTHER): Payer: BC Managed Care – PPO | Admitting: Surgery

## 2013-05-18 ENCOUNTER — Other Ambulatory Visit: Payer: Self-pay | Admitting: Gynecology

## 2013-05-18 ENCOUNTER — Telehealth: Payer: Self-pay

## 2013-05-18 DIAGNOSIS — R102 Pelvic and perineal pain: Secondary | ICD-10-CM

## 2013-05-18 DIAGNOSIS — Z8742 Personal history of other diseases of the female genital tract: Secondary | ICD-10-CM

## 2013-05-18 MED ORDER — OXYCODONE-ACETAMINOPHEN 5-325 MG PO TABS: 1.0000 | ORAL_TABLET | Freq: Four times a day (QID) | ORAL | Status: DC | PRN

## 2013-05-18 NOTE — Telephone Encounter (Signed)
Did she have her followup with the general surgeon as had been recommended? We can give her one more refill and that is it.

## 2013-05-18 NOTE — Telephone Encounter (Signed)
Patient sent an email: "I was hoping that dr Lily PeerFernandez would be willing to refill the medication (Oxycodone) for me one more time . Also I am still spotting and have been for two weeks since I have been taking the birth control. Sometimes just light spotting, but mostly like a complete period. And I have been having a lot of pain too. I'm concerned and can't see dr Lily Peerfernandez until Wednesday afternoon at the earliest."  [facility]

## 2013-05-18 NOTE — Telephone Encounter (Signed)
Dr. Glenetta HewJF, She told us when she contacted us on 04/29/13 that she had rescheduled it to 05/26/13.  I will make sure still scheduled. Written Rx will be provided to patient.

## 2013-05-26 ENCOUNTER — Encounter (INDEPENDENT_AMBULATORY_CARE_PROVIDER_SITE_OTHER): Payer: Self-pay

## 2013-05-26 ENCOUNTER — Ambulatory Visit (INDEPENDENT_AMBULATORY_CARE_PROVIDER_SITE_OTHER): Payer: Self-pay | Admitting: General Surgery

## 2013-05-26 NOTE — Progress Notes (Signed)
Patient scheduled to see Dr. Derrell Lollingamirez today @ 1:30pm.  Patient's appointment was No-Showed @ 1:46 pm since patient has yet to sign in for her appointment.  Patient came into office @ 3:08pm stating she had an appointment today @ 3:00 pm w/Dr. Derrell Lollingamirez.  Patient has had previous appointments that have been cancelled and rescheduled.  Per Dr. Derrell Lollingamirez patient is not to be placed back on his schedule.  I made front desk aware and it has been documented in demographics.  Patient given a new appointment for 05/27/13 w/Dr. Derrell LollingIngram.  Appointment card given to patient in lobby.

## 2013-05-27 ENCOUNTER — Ambulatory Visit (INDEPENDENT_AMBULATORY_CARE_PROVIDER_SITE_OTHER): Payer: Managed Care, Other (non HMO) | Admitting: General Surgery

## 2013-05-28 ENCOUNTER — Encounter (INDEPENDENT_AMBULATORY_CARE_PROVIDER_SITE_OTHER): Payer: Self-pay | Admitting: General Surgery

## 2013-07-30 ENCOUNTER — Encounter (HOSPITAL_COMMUNITY): Payer: Self-pay | Admitting: Emergency Medicine

## 2013-07-30 ENCOUNTER — Emergency Department (HOSPITAL_COMMUNITY)
Admission: EM | Admit: 2013-07-30 | Discharge: 2013-07-30 | Disposition: A | Discharge status: Home / Self Care | Payer: BC Managed Care – PPO | Attending: Emergency Medicine | Admitting: Emergency Medicine

## 2013-07-30 DIAGNOSIS — D649 Anemia, unspecified: Secondary | ICD-10-CM | POA: Insufficient documentation

## 2013-07-30 DIAGNOSIS — Z79899 Other long term (current) drug therapy: Secondary | ICD-10-CM | POA: Insufficient documentation

## 2013-07-30 DIAGNOSIS — Z792 Long term (current) use of antibiotics: Secondary | ICD-10-CM | POA: Insufficient documentation

## 2013-07-30 DIAGNOSIS — J45909 Unspecified asthma, uncomplicated: Secondary | ICD-10-CM | POA: Insufficient documentation

## 2013-07-30 DIAGNOSIS — F17200 Nicotine dependence, unspecified, uncomplicated: Secondary | ICD-10-CM | POA: Insufficient documentation

## 2013-07-30 DIAGNOSIS — F411 Generalized anxiety disorder: Secondary | ICD-10-CM | POA: Insufficient documentation

## 2013-07-30 DIAGNOSIS — Z8742 Personal history of other diseases of the female genital tract: Secondary | ICD-10-CM | POA: Insufficient documentation

## 2013-07-30 DIAGNOSIS — H00019 Hordeolum externum unspecified eye, unspecified eyelid: Secondary | ICD-10-CM | POA: Insufficient documentation

## 2013-07-30 MED ORDER — TOBRAMYCIN 0.3 % OP SOLN
2.0000 [drp] | Freq: Once | OPHTHALMIC | Status: AC
  Administered 2013-07-30: 2 [drp] via OPHTHALMIC
  Filled 2013-07-30: qty 5

## 2013-07-30 MED ORDER — IBUPROFEN 800 MG PO TABS
800.0000 mg | ORAL_TABLET | Freq: Once | ORAL | Status: AC
  Administered 2013-07-30: 800 mg via ORAL
  Filled 2013-07-30: qty 1

## 2013-07-30 NOTE — ED Notes (Signed)
Patient c/o stye to left upper eye. Per patient swelling getting worse. Patient reports slight blurred vision with clear drainage. Reports using warm compresses with no relief. Patient reports "Whit head on upper lid" that she reports "popping with some white drainage."

## 2013-07-30 NOTE — ED Notes (Signed)
Tobramycin drops sent home with pt and pt instructed on use.

## 2013-07-30 NOTE — Discharge Instructions (Signed)
Please use 2 of the tobramycin eyedrops to the left eye every 4 hours for the next 5 days. Please wash hands frequently. Please apply warm compresses to the eye 3 or 4 times daily until resolved. Please see the eye specialist listed above or the eye specialist of your choice if not improving. Sty A sty (hordeolum) is an infection of a gland in the eyelid located at the base of the eyelash. A sty may develop a white or yellow head of pus. It can be puffy (swollen). Usually, the sty will burst and pus will come out on its own. They do not leave lumps in the eyelid once they drain. A sty is often confused with another form of cyst of the eyelid called a chalazion. Chalazions occur within the eyelid and not on the edge where the bases of the eyelashes are. They often are red, sore and then form firm lumps in the eyelid. CAUSES   Germs (bacteria).  Lasting (chronic) eyelid inflammation. SYMPTOMS   Tenderness, redness and swelling along the edge of the eyelid at the base of the eyelashes.  Sometimes, there is a white or yellow head of pus. It may or may not drain. DIAGNOSIS  An ophthalmologist will be able to distinguish between a sty and a chalazion and treat the condition appropriately.  TREATMENT   Styes are typically treated with warm packs (compresses) until drainage occurs.  In rare cases, medicines that kill germs (antibiotics) may be prescribed. These antibiotics may be in the form of drops, cream or pills.  If a hard lump has formed, it is generally necessary to do a small incision and remove the hardened contents of the cyst in a minor surgical procedure done in the office.  In suspicious cases, your caregiver may send the contents of the cyst to the lab to be certain that it is not a rare, but dangerous form of cancer of the glands of the eyelid. HOME CARE INSTRUCTIONS   Wash your hands often and dry them with a clean towel. Avoid touching your eyelid. This may spread the infection to  other parts of the eye.  Apply heat to your eyelid for 10 to 20 minutes, several times a day, to ease pain and help to heal it faster.  Do not squeeze the sty. Allow it to drain on its own. Wash your eyelid carefully 3 to 4 times per day to remove any pus. SEEK IMMEDIATE MEDICAL CARE IF:   Your eye becomes painful or puffy (swollen).  Your vision changes.  Your sty does not drain by itself within 3 days.  Your sty comes back within a short period of time, even with treatment.  You have redness (inflammation) around the eye.  You have a fever. Document Released: 01/02/2005 Document Revised: 06/17/2011 Document Reviewed: 09/06/2008 [facility] Patient Information 2014 [employer] [place].

## 2013-07-30 NOTE — ED Provider Notes (Signed)
CSN: [id number]     Arrival date & time 07/30/13  1719 History   First MD Initiated Contact with Patient 07/30/13 1750     Chief Complaint  Patient presents with  . Stye     (Consider location/radiation/quality/duration/timing/severity/associated sxs/prior Treatment) HPI Comments: Patient states she's had problems with a stye of the upper lids of the left eye for the past 3 or 4 days. She has tried over-the-counter products as well as some warm compresses, but it seems as though the area is not resolving. She even had the oral whitehead on it to pop, and got a little bit of white drainage out of the infected area. She denies any significant visual changes. She's not had any high fever. He's not had any other facial pain or unusual redness. The patient is not diabetic, and she has not had any immune compromising conditions that she is aware of.  The history is provided by the patient.    Past Medical History  Diagnosis Date  . Smoker     5 CIGS A DAY  . Endometriosis   . Ovarian cyst   . Cervicitis   . Asthma     as child, no prob as adult, no inhaler  . Anemia     history with pregnancy, no current prob  . Anxiety     no meds  . History of ovarian cystectomy    Past Surgical History  Procedure Laterality Date  . Cervical biopsy  w/ loop electrode excision  02/2000  . Cesarean section  02/2001  . Wisdom tooth extraction    . Laparoscopy  04/24/2011    Procedure: LAPAROSCOPY OPERATIVE;  Surgeon: Ok EdwardsJuan H Fernandez, MD;  Location: [facility];  Service: Gynecology;  Laterality: N/A;  pelvic washings  . Ovarian cyst removal  04/24/2011    Procedure: OVARIAN CYSTECTOMY;  Surgeon: Ok EdwardsJuan H Fernandez, MD;  Location: [facility];  Service: Gynecology;  Laterality: Left;  Marland Kitchen. Tympanostomy tube placement     Family History  Problem Relation Age of Onset  . Adopted: Yes  . Hypertension Father   . Cancer Maternal Grandmother     OVARIAN  . Breast cancer Maternal Grandmother   . Heart disease  Maternal Grandmother   . Heart disease Maternal Grandfather   . Heart disease Paternal Grandmother   . Heart disease Paternal Grandfather    History  Substance Use Topics  . Smoking status: Current Every Day Smoker -- 0.25 packs/day for 12 years    Types: Cigarettes  . Smokeless tobacco: Never Used  . Alcohol Use: No   OB History   Grav Para Term Preterm Abortions TAB SAB Ect Mult Living   1 1 1       1      Review of Systems  Constitutional: Negative for activity change.       All ROS Neg except as noted in HPI  HENT: Negative for nosebleeds.   Eyes: Positive for pain. Negative for photophobia and discharge.  Respiratory: Negative for cough, shortness of breath and wheezing.   Cardiovascular: Negative for chest pain and palpitations.  Gastrointestinal: Negative for abdominal pain and blood in stool.  Genitourinary: Negative for dysuria, frequency and hematuria.  Musculoskeletal: Negative for arthralgias, back pain and neck pain.  Skin: Negative.   Neurological: Negative for dizziness, seizures and speech difficulty.  Psychiatric/Behavioral: Negative for hallucinations and confusion. The patient is nervous/anxious.       Allergies  Benzoyl peroxide and Other  Home Medications  Prior to Admission medications   Medication Sig Start Date End Date Taking? Authorizing Provider  ferrous sulfate 325 (65 FE) MG tablet Take 325 mg by mouth daily with breakfast.    Historical Provider, MD  fluconazole (DIFLUCAN) 150 MG tablet Take 1 tablet (150 mg total) by mouth once. 04/27/13   Ok EdwardsJuan H Fernandez, MD  levonorgestrel-ethinyl estradiol Patria Mane(AVIANE,ALESSE,LESSINA) 0.1-20 MG-MCG tablet Patient to take continous and withdraw every third pack 04/27/13   Ok EdwardsJuan H Fernandez, MD  metroNIDAZOLE (METROGEL) 0.75 % vaginal gel Place vaginally at bedtime. For 1 week 04/28/13   Ok EdwardsJuan H Fernandez, MD  nitrofurantoin, macrocrystal-monohydrate, (MACROBID) 100 MG capsule Take 1 capsule (100 mg total) by mouth  2 (two) times daily. 04/28/13   Ok EdwardsJuan H Fernandez, MD  oxyCODONE-acetaminophen (PERCOCET/ROXICET) 5-325 MG per tablet Take 1-2 tablets by mouth every 6 (six) hours as needed. 05/18/13   Ok EdwardsJuan H Fernandez, MD  promethazine (PHENERGAN) 25 MG tablet Take 25 mg by mouth every 6 (six) hours as needed. 01/22/13 01/29/13  Historical Provider, MD   BP 118/78  Pulse 90  Temp(Src) 98.2 F (36.8 C) (Oral)  Resp 16  Ht 5' 5.5" (1.664 m)  Wt 122 lb (55.339 kg)  BMI 19.99 kg/m2  SpO2 98%  LMP 07/12/2013 Physical Exam  Nursing note and vitals reviewed. Constitutional: She is oriented to person, place, and time. She appears well-developed and well-nourished.  Non-toxic appearance.  HENT:  Head: Normocephalic.  Right Ear: Tympanic membrane and external ear normal.  Left Ear: Tympanic membrane and external ear normal.  Eyes: EOM and lids are normal. Pupils are equal, round, and reactive to light. Right eye exhibits no chemosis and no hordeolum. No foreign body present in the right eye. Left eye exhibits hordeolum. Left eye exhibits no chemosis. No foreign body present in the left eye. Right conjunctiva is not injected. Left conjunctiva is injected. No scleral icterus.    Neck: Normal range of motion. Neck supple. Carotid bruit is not present.  Cardiovascular: Normal rate, regular rhythm, normal heart sounds, intact distal pulses and normal pulses.   Pulmonary/Chest: Breath sounds normal. No respiratory distress.  Abdominal: Soft. Bowel sounds are normal. There is no tenderness. There is no guarding.  Musculoskeletal: Normal range of motion.  Lymphadenopathy:       Head (right side): No submandibular adenopathy present.       Head (left side): No submandibular adenopathy present.    She has no cervical adenopathy.  Neurological: She is alert and oriented to person, place, and time. She has normal strength. No cranial nerve deficit or sensory deficit.  Skin: Skin is warm and dry.  Psychiatric: She has a  normal mood and affect. Her speech is normal.    ED Course  Procedures (including critical care time) Labs Review Labs Reviewed - No data to display  Imaging Review No results found.   EKG Interpretation None      MDM Patient has a stye involving the left upper eyelid. The patient is advised to use warm compresses 3 or 4 times daily. She is advised to use tobramycin ophthalmic drops every 4 hours for the next 5 days. She's advised to see the eye specialist if this is not improving.    Final diagnoses:  None    *I have reviewed nursing notes, vital signs, and all appropriate lab and imaging results for this patient.Kathie Dike**    Dealva Lafoy M Remmy Riffe, PA-C 07/30/13 [phone]

## 2013-07-30 NOTE — ED Notes (Signed)
States she noticed a stye on L. Eye a couple of days ago. States she woke up this morning with L. eye "almost completely swollen shut". Currently, L. Eyelid is reddened and edematous.

## 2013-07-31 NOTE — ED Provider Notes (Signed)
Medical screening examination/treatment/procedure(s) were performed by non-physician practitioner and as supervising physician I was immediately available for consultation/collaboration.   EKG Interpretation None      Devoria AlbeIva Harrel Ferrone, MD, Armando GangFACEP   Ward GivensIva L Bharat Antillon, MD 07/31/13 [phone]

## 2013-09-21 ENCOUNTER — Encounter: Payer: Managed Care, Other (non HMO) | Admitting: Gynecology

## 2013-10-19 ENCOUNTER — Encounter (HOSPITAL_COMMUNITY): Payer: Self-pay | Admitting: Emergency Medicine

## 2013-10-19 ENCOUNTER — Emergency Department (HOSPITAL_COMMUNITY)
Admission: EM | Admit: 2013-10-19 | Discharge: 2013-10-19 | Discharge status: Against Medical Advice | Payer: BC Managed Care – PPO | Attending: Emergency Medicine | Admitting: Emergency Medicine

## 2013-10-19 DIAGNOSIS — Z79899 Other long term (current) drug therapy: Secondary | ICD-10-CM | POA: Insufficient documentation

## 2013-10-19 DIAGNOSIS — F411 Generalized anxiety disorder: Secondary | ICD-10-CM | POA: Insufficient documentation

## 2013-10-19 DIAGNOSIS — Z3202 Encounter for pregnancy test, result negative: Secondary | ICD-10-CM | POA: Insufficient documentation

## 2013-10-19 DIAGNOSIS — N390 Urinary tract infection, site not specified: Secondary | ICD-10-CM | POA: Insufficient documentation

## 2013-10-19 DIAGNOSIS — Z9079 Acquired absence of other genital organ(s): Secondary | ICD-10-CM | POA: Insufficient documentation

## 2013-10-19 DIAGNOSIS — J45909 Unspecified asthma, uncomplicated: Secondary | ICD-10-CM | POA: Insufficient documentation

## 2013-10-19 DIAGNOSIS — F17200 Nicotine dependence, unspecified, uncomplicated: Secondary | ICD-10-CM | POA: Insufficient documentation

## 2013-10-19 DIAGNOSIS — M5442 Lumbago with sciatica, left side: Secondary | ICD-10-CM

## 2013-10-19 DIAGNOSIS — R1032 Left lower quadrant pain: Secondary | ICD-10-CM

## 2013-10-19 DIAGNOSIS — R3915 Urgency of urination: Secondary | ICD-10-CM

## 2013-10-19 DIAGNOSIS — R21 Rash and other nonspecific skin eruption: Secondary | ICD-10-CM | POA: Insufficient documentation

## 2013-10-19 DIAGNOSIS — D649 Anemia, unspecified: Secondary | ICD-10-CM | POA: Insufficient documentation

## 2013-10-19 DIAGNOSIS — Z8742 Personal history of other diseases of the female genital tract: Secondary | ICD-10-CM | POA: Insufficient documentation

## 2013-10-19 DIAGNOSIS — Z9889 Other specified postprocedural states: Secondary | ICD-10-CM | POA: Insufficient documentation

## 2013-10-19 DIAGNOSIS — M5430 Sciatica, unspecified side: Secondary | ICD-10-CM | POA: Insufficient documentation

## 2013-10-19 LAB — CBC WITH DIFFERENTIAL/PLATELET
BASOS ABS: 0 10*3/uL (ref 0.0–0.1)
Basophils Relative: 0 % (ref 0–1)
EOS PCT: 3 % (ref 0–5)
Eosinophils Absolute: 0.2 10*3/uL (ref 0.0–0.7)
HCT: 41 % (ref 36.0–46.0)
HEMOGLOBIN: 13.5 g/dL (ref 12.0–15.0)
LYMPHS ABS: 2.7 10*3/uL (ref 0.7–4.0)
Lymphocytes Relative: 35 % (ref 12–46)
MCH: 26.5 pg (ref 26.0–34.0)
MCHC: 32.9 g/dL (ref 30.0–36.0)
MCV: 80.6 fL (ref 78.0–100.0)
MONO ABS: 0.5 10*3/uL (ref 0.1–1.0)
MONOS PCT: 7 % (ref 3–12)
NEUTROS ABS: 4.3 10*3/uL (ref 1.7–7.7)
Neutrophils Relative %: 55 % (ref 43–77)
Platelets: 302 10*3/uL (ref 150–400)
RBC: 5.09 MIL/uL (ref 3.87–5.11)
RDW: 18.2 % — ABNORMAL HIGH (ref 11.5–15.5)
WBC: 7.7 10*3/uL (ref 4.0–10.5)

## 2013-10-19 LAB — URINALYSIS, ROUTINE W REFLEX MICROSCOPIC
GLUCOSE, UA: NEGATIVE mg/dL
KETONES UR: NEGATIVE mg/dL
Leukocytes, UA: NEGATIVE
NITRITE: POSITIVE — AB
PROTEIN: NEGATIVE mg/dL
Specific Gravity, Urine: 1.025 (ref 1.005–1.030)
Urobilinogen, UA: 0.2 mg/dL (ref 0.0–1.0)
pH: 5 (ref 5.0–8.0)

## 2013-10-19 LAB — COMPREHENSIVE METABOLIC PANEL
ALT: 11 U/L (ref 0–35)
AST: 16 U/L (ref 0–37)
Albumin: 3.8 g/dL (ref 3.5–5.2)
Alkaline Phosphatase: 88 U/L (ref 39–117)
Anion gap: 14 (ref 5–15)
BILIRUBIN TOTAL: 0.2 mg/dL — AB (ref 0.3–1.2)
BUN: 7 mg/dL (ref 6–23)
CALCIUM: 9.9 mg/dL (ref 8.4–10.5)
CHLORIDE: 100 meq/L (ref 96–112)
CO2: 27 meq/L (ref 19–32)
CREATININE: 0.59 mg/dL (ref 0.50–1.10)
GLUCOSE: 135 mg/dL — AB (ref 70–99)
Potassium: 4.2 mEq/L (ref 3.7–5.3)
Sodium: 141 mEq/L (ref 137–147)
Total Protein: 7.9 g/dL (ref 6.0–8.3)

## 2013-10-19 LAB — URINE MICROSCOPIC-ADD ON

## 2013-10-19 LAB — POC URINE PREG, ED: PREG TEST UR: NEGATIVE

## 2013-10-19 MED ORDER — KETOROLAC TROMETHAMINE 60 MG/2ML IM SOLN
60.0000 mg | Freq: Once | INTRAMUSCULAR | Status: AC
  Administered 2013-10-19: 60 mg via INTRAMUSCULAR
  Filled 2013-10-19: qty 2

## 2013-10-19 MED ORDER — DIAZEPAM 5 MG/ML IJ SOLN
2.5000 mg | Freq: Once | INTRAMUSCULAR | Status: AC
  Administered 2013-10-19: 2.5 mg via INTRAMUSCULAR
  Filled 2013-10-19: qty 2

## 2013-10-19 NOTE — ED Notes (Signed)
Pt walked out stating the injections were to painful and did not want any further treatment. Pt ambulated out without distress.

## 2013-10-19 NOTE — ED Provider Notes (Signed)
Medical screening examination/treatment/procedure(s) were performed by non-physician practitioner and as supervising physician I was immediately available for consultation/collaboration.   EKG Interpretation None      Tate Jerkins, MD, FACEP   Glendale Wherry L Zenith Kercheval, MD 10/19/13 2041 
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
## 2013-10-19 NOTE — ED Notes (Signed)
Patient left room, tearful and ambulated to waiting area.
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
## 2013-11-03 ENCOUNTER — Emergency Department (HOSPITAL_COMMUNITY)
Admission: EM | Admit: 2013-11-03 | Discharge: 2013-11-03 | Disposition: A | Discharge status: Home / Self Care | Payer: Self-pay | Attending: Emergency Medicine | Admitting: Emergency Medicine

## 2013-11-03 ENCOUNTER — Emergency Department (HOSPITAL_COMMUNITY): Payer: BC Managed Care – PPO

## 2013-11-03 ENCOUNTER — Emergency Department (HOSPITAL_COMMUNITY): Payer: Self-pay

## 2013-11-03 ENCOUNTER — Encounter (HOSPITAL_COMMUNITY): Payer: Self-pay | Admitting: Emergency Medicine

## 2013-11-03 DIAGNOSIS — F17200 Nicotine dependence, unspecified, uncomplicated: Secondary | ICD-10-CM | POA: Insufficient documentation

## 2013-11-03 DIAGNOSIS — Z9889 Other specified postprocedural states: Secondary | ICD-10-CM | POA: Insufficient documentation

## 2013-11-03 DIAGNOSIS — D649 Anemia, unspecified: Secondary | ICD-10-CM | POA: Insufficient documentation

## 2013-11-03 DIAGNOSIS — Z8659 Personal history of other mental and behavioral disorders: Secondary | ICD-10-CM | POA: Insufficient documentation

## 2013-11-03 DIAGNOSIS — J45909 Unspecified asthma, uncomplicated: Secondary | ICD-10-CM | POA: Insufficient documentation

## 2013-11-03 DIAGNOSIS — N898 Other specified noninflammatory disorders of vagina: Secondary | ICD-10-CM | POA: Insufficient documentation

## 2013-11-03 DIAGNOSIS — R1031 Right lower quadrant pain: Secondary | ICD-10-CM | POA: Insufficient documentation

## 2013-11-03 DIAGNOSIS — R599 Enlarged lymph nodes, unspecified: Secondary | ICD-10-CM | POA: Insufficient documentation

## 2013-11-03 DIAGNOSIS — Z3202 Encounter for pregnancy test, result negative: Secondary | ICD-10-CM | POA: Insufficient documentation

## 2013-11-03 LAB — URINALYSIS, ROUTINE W REFLEX MICROSCOPIC
Bilirubin Urine: NEGATIVE
GLUCOSE, UA: NEGATIVE mg/dL
Hgb urine dipstick: NEGATIVE
Ketones, ur: NEGATIVE mg/dL
LEUKOCYTES UA: NEGATIVE
Nitrite: NEGATIVE
PH: 7 (ref 5.0–8.0)
Protein, ur: NEGATIVE mg/dL
SPECIFIC GRAVITY, URINE: 1.038 — AB (ref 1.005–1.030)
Urobilinogen, UA: 1 mg/dL (ref 0.0–1.0)

## 2013-11-03 LAB — CBC WITH DIFFERENTIAL/PLATELET
BASOS ABS: 0 10*3/uL (ref 0.0–0.1)
Basophils Relative: 0 % (ref 0–1)
Eosinophils Absolute: 0.3 10*3/uL (ref 0.0–0.7)
Eosinophils Relative: 2 % (ref 0–5)
HCT: 39.4 % (ref 36.0–46.0)
Hemoglobin: 12.7 g/dL (ref 12.0–15.0)
Lymphocytes Relative: 23 % (ref 12–46)
Lymphs Abs: 3.4 10*3/uL (ref 0.7–4.0)
MCH: 26.8 pg (ref 26.0–34.0)
MCHC: 32.2 g/dL (ref 30.0–36.0)
MCV: 83.3 fL (ref 78.0–100.0)
Monocytes Absolute: 0.7 10*3/uL (ref 0.1–1.0)
Monocytes Relative: 5 % (ref 3–12)
Neutro Abs: 10.2 10*3/uL — ABNORMAL HIGH (ref 1.7–7.7)
Neutrophils Relative %: 70 % (ref 43–77)
PLATELETS: 324 10*3/uL (ref 150–400)
RBC: 4.73 MIL/uL (ref 3.87–5.11)
RDW: 17.6 % — AB (ref 11.5–15.5)
WBC: 14.6 10*3/uL — AB (ref 4.0–10.5)

## 2013-11-03 LAB — WET PREP, GENITAL
Clue Cells Wet Prep HPF POC: NONE SEEN
Trich, Wet Prep: NONE SEEN
YEAST WET PREP: NONE SEEN

## 2013-11-03 LAB — COMPREHENSIVE METABOLIC PANEL
ALK PHOS: 78 U/L (ref 39–117)
ALT: 11 U/L (ref 0–35)
AST: 16 U/L (ref 0–37)
Albumin: 3.4 g/dL — ABNORMAL LOW (ref 3.5–5.2)
Anion gap: 13 (ref 5–15)
BUN: 10 mg/dL (ref 6–23)
CO2: 29 meq/L (ref 19–32)
Calcium: 9.4 mg/dL (ref 8.4–10.5)
Chloride: 99 mEq/L (ref 96–112)
Creatinine, Ser: 0.63 mg/dL (ref 0.50–1.10)
GFR calc Af Amer: >90 mL/min (ref >90)
Glucose, Bld: 77 mg/dL (ref 70–99)
POTASSIUM: 4 meq/L (ref 3.7–5.3)
SODIUM: 141 meq/L (ref 137–147)
Total Protein: 6.9 g/dL (ref 6.0–8.3)

## 2013-11-03 LAB — RPR

## 2013-11-03 LAB — PREGNANCY, URINE: Preg Test, Ur: NEGATIVE

## 2013-11-03 LAB — HIV ANTIBODY (ROUTINE TESTING W REFLEX): HIV 1&2 Ab, 4th Generation: NONREACTIVE

## 2013-11-03 MED ORDER — ONDANSETRON HCL 4 MG/2ML IJ SOLN
4.0000 mg | Freq: Once | INTRAMUSCULAR | Status: AC
  Administered 2013-11-03: 4 mg via INTRAVENOUS
  Filled 2013-11-03: qty 2

## 2013-11-03 MED ORDER — IBUPROFEN 800 MG PO TABS: 800.0000 mg | ORAL_TABLET | Freq: Three times a day (TID) | ORAL | Status: DC | PRN

## 2013-11-03 MED ORDER — MORPHINE SULFATE 4 MG/ML IJ SOLN
4.0000 mg | Freq: Once | INTRAMUSCULAR | Status: AC
  Administered 2013-11-03: 4 mg via INTRAVENOUS
  Filled 2013-11-03: qty 1

## 2013-11-03 MED ORDER — HYDROMORPHONE HCL PF 1 MG/ML IJ SOLN
1.0000 mg | INTRAMUSCULAR | Status: AC
  Administered 2013-11-03: 1 mg via INTRAVENOUS
  Filled 2013-11-03: qty 1

## 2013-11-03 MED ORDER — SODIUM CHLORIDE 0.9 % IV BOLUS (SEPSIS)
1000.0000 mL | Freq: Once | INTRAVENOUS | Status: AC
  Administered 2013-11-03: 1000 mL via INTRAVENOUS

## 2013-11-03 MED ORDER — DOCUSATE SODIUM 100 MG PO CAPS: 100.0000 mg | ORAL_CAPSULE | Freq: Two times a day (BID) | ORAL | Status: DC | PRN

## 2013-11-03 MED ORDER — OXYCODONE-ACETAMINOPHEN 5-325 MG PO TABS
1.0000 | ORAL_TABLET | Freq: Once | ORAL | Status: AC
  Administered 2013-11-03: 1 via ORAL
  Filled 2013-11-03: qty 1

## 2013-11-03 MED ORDER — HYDROCODONE-ACETAMINOPHEN 5-325 MG PO TABS
1.0000 | ORAL_TABLET | ORAL | Status: DC | PRN
Start: 2013-11-03 — End: 2014-01-11

## 2013-11-03 MED ORDER — IOHEXOL 300 MG/ML  SOLN
80.0000 mL | Freq: Once | INTRAMUSCULAR | Status: AC | PRN
  Administered 2013-11-03: 80 mL via INTRAVENOUS

## 2013-11-03 NOTE — Discharge Instructions (Signed)
Read the information below.  Use the prescribed medication as directed.  Please discuss all new medications with your pharmacist.  Do not take additional tylenol while taking the prescribed pain medication to avoid overdose.  You may return to the Emergency Department at any time for worsening condition or any new symptoms that concern you.  If you develop high fevers, worsening abdominal pain, uncontrolled vomiting, or are unable to tolerate fluids by mouth, return to the ER for a recheck.  ° °Abdominal Pain °Many things can cause abdominal pain. Usually, abdominal pain is not caused by a disease and will improve without treatment. It can often be observed and treated at home. Your health care provider will do a physical exam and possibly order blood tests and X-rays to help determine the seriousness of your pain. However, in many cases, more time must pass before a clear cause of the pain can be found. Before that point, your health care provider may not know if you need more testing or further treatment. °HOME CARE INSTRUCTIONS  °Monitor your abdominal pain for any changes. The following actions may help to alleviate any discomfort you are experiencing: °· Only take over-the-counter or prescription medicines as directed by your health care provider. °· Do not take laxatives unless directed to do so by your health care provider. °· Try a clear liquid diet (broth, tea, or water) as directed by your health care provider. Slowly move to a bland diet as tolerated. °SEEK MEDICAL CARE IF: °· You have unexplained abdominal pain. °· You have abdominal pain associated with nausea or diarrhea. °· You have pain when you urinate or have a bowel movement. °· You experience abdominal pain that wakes you in the night. °· You have abdominal pain that is worsened or improved by eating food. °· You have abdominal pain that is worsened with eating fatty foods. °· You have a fever. °SEEK IMMEDIATE MEDICAL CARE IF:  °· Your pain does  not go away within 2 hours. °· You keep throwing up (vomiting). °· Your pain is felt only in portions of the abdomen, such as the right side or the left lower portion of the abdomen. °· You pass bloody or black tarry stools. °MAKE SURE YOU: °· Understand these instructions.   °· Will watch your condition.   °· Will get help right away if you are not doing well or get worse.   °Document Released: 01/02/2005 Document Revised: 03/30/2013 Document Reviewed: 12/02/2012 °ExitCare® Patient Information ©2015 [employer]. This information is not intended to replace advice given to you by your health care provider. Make sure you discuss any questions you have with your health care provider. ° ° ° °Emergency Department Resource Guide °1) Find a Doctor and Pay Out of Pocket °Although you won't have to find out who is covered by your insurance plan, it is a good idea to ask around and get recommendations. You will then need to call the office and see if the doctor you have chosen will accept you as a new patient and what types of options they offer for patients who are self-pay. Some doctors offer discounts or will set up payment plans for their patients who do not have insurance, but you will need to ask so you aren't surprised when you get to your appointment. ° °2) Contact Your Local Health Department °Not all health departments have doctors that can see patients for sick visits, but many do, so it is worth a call to see if yours does. If you   If you don't know where your local health department is, you can check in your phone book. The CDC also has a tool to help you locate your state's health department, and many state websites also have listings of all of their local health departments.  3) Find a Walk-in Clinic If your illness is not likely to be very severe or complicated, you may want to try a walk in clinic. These are popping up all over the country in pharmacies, drugstores, and shopping centers. They're usually  staffed by nurse practitioners or physician assistants that have been trained to treat common illnesses and complaints. They're usually fairly quick and inexpensive. However, if you have serious medical issues or chronic medical problems, these are probably not your best option.  No Primary Care Doctor: - Call Health Connect at  [phone] - they can help you locate a primary care doctor that  accepts your insurance, provides certain services, etc. - Physician Referral Service- [phone]  Chronic Pain Problems: Organization         Address  Phone   Notes  [facility]  [phone] Patients need to be referred by their primary care doctor.   Medication Assistance: Organization         Address  Phone   Notes  [facility] [address] [place], [place] [address] [phone]) [phone] --Must be a resident of [place] -- Must have NO insurance coverage whatsoever (no Medicaid/ Medicare, etc.) -- The pt. MUST have a primary care doctor that directs their care regularly and follows them in the community   [facility]  [phone]   [employer]  [phone]    Agencies that provide inexpensive medical care: Organization         Address  Phone   Notes  [facility]  [phone]   [facility]    [phone]   [facility] [address] [place], [place] [address] [phone]   [facility] [address], [place] [phone]   [facility]    [phone](336) [phone]   [facility]    [phone]   [facility]  [address], [place] Phone:  [phone], Fax:  [phone]) [phone] Hours of Operation:  9 am - 6 pm, M-F.  Also accepts Medicaid/Medicare and self-pay.  [facility]  [address], [place] Phone: [phone]) [phone], Fax: [phone]. Hours of  Operation:  8:30 am - 5:30 pm, M-F.  Also accepts Medicaid and self-pay.  [facility] [place] [address], [place] Phone: [phone]) [phone]   [facility] [address], [place], [place] [phone], Ext. [phone] Mondays & Thursdays: 7-9 AM.  First 15 patients are seen on a first come, first serve basis.    Medicaid-accepting [facility] Providers:  Organization         Address  Phone   Notes  [facility] [address], [place] [phone] Also accepts self-pay patients.  [facility] [address], [place]  [phone]   [facility] [address], [place] [phone]) [phone]   [facility] [address] [place] [address], [place] [phone]   Renaye RakersVeita Bland [address], [place]   [phone] Only  Access Medicaid patients after they have their name applied to their card.  ° °Self-Pay (no insurance) in [place]: ° °Organization         Address  Phone   Notes  °Sickle Cell Patients, [facility] [address], [place] [phone]   °[facility] [address], [place] ([phone]) [phone]   °[facility] [place] ° [address], [place] [phone]) [phone]   °[facility]/Dr. Osei-Bonsu ° [address] [place] [address], [place] or [address], [place] [phone]) [phone] Phone number for both [place] and [place] locations is the same.  °[facility] [address], [place] [phone]   °[facility] [address] [place] [address], [place] or [address] (336) [phone] °(336) [phone]   °[facility] [address], [place] ([phone]) [phone], phone; [phone], fax Sees patients 1st and 3rd Saturday of every month.  Must not qualify for public or private insurance (i.e. Medicaid, Medicare, [place] Health  Choice, Veterans' Benefits) • Household income should be no more than 200% of the poverty level •The clinic cannot treat you if you are pregnant or think you are pregnant • Sexually transmitted diseases are not treated at the clinic.  ° ° °Dental Care: °Organization         Address  Phone  Notes  °[place] [facility] [address], [place] [phone]) [phone] Accepts children up to age 21 who are enrolled in Medicaid or [place] Health Choice; pregnant women with a Medicaid card; and children who have applied for Medicaid or [place] Health Choice, but were declined, whose parents can pay a reduced fee at time of service.  °[place] [facility] [place]  [address], [place] [phone]) [phone] Accepts children up to age 21 who are enrolled in Medicaid or [place] Health Choice; pregnant women with a Medicaid card; and children who have applied for Medicaid or [place] Health Choice, but were declined, whose parents can pay a reduced fee at time of service.  °[facility] ° [address],  ([phone]) [phone] Patients are seen by appointment only. Walk-ins are not accepted. [facility] will see patients 18 years of age and older. °Monday - Tuesday (8am-5pm) °Most Wednesdays (8:30-5pm) °$30 per visit, cash only  °[facility] ° [address], [place] ([phone]) [phone] Patients are seen by appointment only. Walk-ins are not accepted. [facility] will see patients 18 years of age and older. °One Wednesday Evening (Monthly: Volunteer Based).  $30 per visit, cash only  °[facility]  ([phone]) [phone] for adults; Children under age 4, call Graduate Pediatric Dentistry at [phone]. Children aged 4-14, please call [phone] to request a pediatric application. ° Dental services are provided in all areas of dental care including fillings, crowns and bridges, complete  and partial dentures, implants, gum treatment, root canals, and extractions. Preventive care is also provided. Treatment is provided to both adults and children. °Patients are selected via a lottery and there is often a waiting list. °  °[facility] [address], ° ° (336) 763-8833 [email or web address] °  °[facility] [address], [place],  ([phone], Ext. [phone] Second and Fourth Thursday of each month, opens at 6:30 AM; Clinic ends at 9   AM.  Patients are seen on a first-come first-served basis, and a limited number are seen during each clinic.  ° °[facility] ° [address], [place], [place] [phone]) [phone]   Eligibility Requirements °You must have lived in [place], [place], or [place] counties for at least the last three months. °  You cannot be eligible for state or federal sponsored healthcare insurance, including Veterans Administration, Medicaid, or Medicare. °  You generally cannot be eligible for healthcare insurance through your employer.  °  How to apply: °Eligibility screenings are held every Tuesday and Wednesday afternoon from 1:00 pm until 4:00 pm. You do not need an appointment for the interview!  °[facility] [address], [place], [place] [phone]   °[facility]  336-342-8273   °[facility]  336-703-3100   °[facility]  336-570-6415   ° °Behavioral Health Resources in the Community: °Intensive Outpatient Programs °Organization         Address  Phone  Notes  °[facility] [address], [place], [place] 336-878-6098   °[facility] [address], [place], [place] 336-832-9800   °[facility] [address], [place], [place] ° 336-882-2125   °[facility] [address],  °[place], [place] 1-800-853-5163 or 336-641-4981   °Substance Abuse Resources °Organization          Address  Phone  Notes  °[facility]  336-882-2125   °[facility]  336-784-9470   °[facility]  336-285-9073   °[facility]  336-845-3988   °Residential & Outpatient Substance Abuse Program  1-800-659-3381   °Psychological Services °Organization         Address  Phone  Notes  °[facility]  336- 832-9600   °[facility]  336- 378-7881   °[facility] [address], [place] 1-800-853-5163 or 336-641-4981   ° °Mobile Crisis Teams °Organization         Address  Phone  Notes  °[facility], Mobile Crisis Care Unit  1-877-626-1772   °Assertive °[facility] ° [address] [place], [place] 336-834-9664   °Sharon DeEsch [address] °[place] 336-554-5454   ° °Self-Help/Support Groups °Organization         Address  Phone             Notes  °[facility] - variety of support groups  336- 373-1402 Call for more information  °Narcotics Anonymous (NA), [facility] [address], °[place]  2 meetings at this location  ° °Residential Treatment Programs °Organization         Address  Phone  Notes  °[facility] [address],    °[place]  1-866-801-8205   °[facility] ° [address], [place], [place] 704-293-8524   °[facility] [address], [place] 336-845-3988 Admissions: 8am-3pm M-F  °[facility] [address]    °[place], [place] 336-841-1104   °[facility] [address], [place], [place] 336-379-7146   °[facility] [address]  °[place], [place] 336-285-9073   °[facility] Intensive Outpatient [address], [place], [place] 336-852-3033   °[facility] [address]  °[place],  1-877-615-2722 or 336-784-9470   °[facility] ([facility])   [address] [place], [place]  336-227-7417 Accepts Medicaid  °[facility] [address]  °[place] 1-800-659-3381 Substance Abuse/Addiction Treatment  ° °[place] Behavioral Health Resources °Organization         Address  Phone  Notes  °[facility]  (888) 581-9988   °Julie Brannon, PhD [address] [place], [place]   (336) 349-5553 or (336) 951-0000   °[facility]   [address] °[place], [place] (336) 349-4454   °[facility] [address], [place], [place] (336) 342-8316 Insurance/Medicaid/sponsorship through [facility]  °[facility] [address]                                    [place], [place] (336) 342-8316 Therapy/tele-psych/case  °[facility] [address]  ° [place], [place] (336) 349-2233    °Dr. Arfeen  (336) 349-4544   °[facility] [place]  [employer] [place] [facility] 1) [address], [place] °2) [address], [place] °3)  [address], [place] (336) 349-3220 °(336) 342-7768 ° °(336) 342-8140   °[place] Child Abuse [facility]line (336) 342-1394 or (336) 342-3537 (After Hours)    ° ° ° °

## 2013-11-03 NOTE — ED Provider Notes (Signed)
Medical screening examination/treatment/procedure(s) were performed by non-physician practitioner and as supervising physician I was immediately available for consultation/collaboration.   EKG Interpretation None       Olivia Mackielga M Jakell Trusty, MD 11/03/13 1801

## 2013-11-03 NOTE — ED Notes (Signed)
Patient presebts with lump on the right side ?hernia

## 2013-11-03 NOTE — ED Notes (Signed)
PA at the bedside.

## 2013-11-03 NOTE — ED Provider Notes (Signed)
CSN: [id number]     Arrival date & time 11/03/13  0434 History   First MD Initiated Contact with Patient 11/03/13 [phone]     Chief Complaint  Patient presents with  . Groin Pain  . Mass    Right groin     (Consider location/radiation/quality/duration/timing/severity/associated sxs/prior Treatment) The history is provided by the patient.    Patient with acute onset RLQ abdominal/pelvic pain that began overnight, waking her from sleep.  Pain is aching and occasionally sharp, progressively getting worse, 8/10 intensity currently. No meds at home. She has noted a tender nodule in her right groin that she states is new.  Has had associated nausea.  Several months of urinary urgency.  LMP January, she is unsure why she stopped having periods but thinks it is due to increased stress and weight loss.  Denies change in bowel habits.  Denies fevers, chills, myalgias.  Denies abnormal vaginal discharge.  Denies any trauma.  Is sexually active with one monogamous partner, does not use birth control.   Prior abdominal surgeries: c-section, ovarian cystectomy.   [id number]  Past Medical History  Diagnosis Date  . Smoker     5 CIGS A DAY  . Ovarian cyst   . Cervicitis   . Asthma     as child, no prob as adult, no inhaler  . Anemia     history with pregnancy, no current prob  . Anxiety     no meds  . History of ovarian cystectomy    Past Surgical History  Procedure Laterality Date  . Cervical biopsy  w/ loop electrode excision  02/2000  . Cesarean section  02/2001  . Wisdom tooth extraction    . Laparoscopy  04/24/2011    Procedure: LAPAROSCOPY OPERATIVE;  Surgeon: Ok Edwards, MD;  Location: [facility];  Service: Gynecology;  Laterality: N/A;  pelvic washings  . Ovarian cyst removal  04/24/2011    Procedure: OVARIAN CYSTECTOMY;  Surgeon: Ok Edwards, MD;  Location: [facility];  Service: Gynecology;  Laterality: Left;  Marland Kitchen Tympanostomy tube placement     Family History  Problem Relation Age of  Onset  . Adopted: Yes  . Hypertension Father   . Cancer Maternal Grandmother     OVARIAN  . Breast cancer Maternal Grandmother   . Heart disease Maternal Grandmother   . Heart disease Maternal Grandfather   . Heart disease Paternal Grandmother   . Heart disease Paternal Grandfather    History  Substance Use Topics  . Smoking status: Current Every Day Smoker -- 0.25 packs/day for 12 years    Types: Cigarettes  . Smokeless tobacco: Never Used  . Alcohol Use: Yes     Comment: ocassionally   OB History   Grav Para Term Preterm Abortions TAB SAB Ect Mult Living   1 1 1       1      Review of Systems  All other systems reviewed and are negative.     Allergies  Benzoyl peroxide and Other  Home Medications   Prior to Admission medications   Medication Sig Start Date End Date Taking? Authorizing Provider  ferrous sulfate 325 (65 FE) MG tablet Take 325 mg by mouth every evening.    Yes Historical Provider, MD   BP 123/79  Pulse 96  Temp(Src) 98.6 F (37 C) (Oral)  Resp 20  Ht 5\' 5"  (1.651 m)  Wt 115 lb (52.164 kg)  BMI 19.14 kg/m2  SpO2 97%  LMP 04/19/2013 Physical  Exam  Nursing note and vitals reviewed. Constitutional: She appears well-developed and well-nourished. No distress.  HENT:  Head: Normocephalic and atraumatic.  Neck: Neck supple.  Cardiovascular: Normal rate and regular rhythm.   Pulmonary/Chest: Effort normal and breath sounds normal. No respiratory distress. She has no wheezes. She has no rales.  Abdominal: Soft. She exhibits no distension. There is tenderness in the right lower quadrant. There is no rebound, no guarding and no CVA tenderness.  Genitourinary: Uterus is tender. Cervix exhibits no motion tenderness. Right adnexum displays no mass and no tenderness. Left adnexum displays no mass and no tenderness. Vaginal discharge found.  Midline tenderness.  Moderate amount of white vaginal discharge.    Single right inguinal lymph node is enlarged,  tender, mobile.    Lymphadenopathy:       Right: Inguinal adenopathy present.  Neurological: She is alert.  Skin: She is not diaphoretic.    ED Course  Procedures (including critical care time) Labs Review Labs Reviewed  WET PREP, GENITAL - Abnormal; Notable for the following:    WBC, Wet Prep HPF POC FEW (*)    All other components within normal limits  CBC WITH DIFFERENTIAL - Abnormal; Notable for the following:    WBC 14.6 (*)    RDW 17.6 (*)    Neutro Abs 10.2 (*)    All other components within normal limits  COMPREHENSIVE METABOLIC PANEL - Abnormal; Notable for the following:    Albumin 3.4 (*)    Total Bilirubin <0.2 (*)    All other components within normal limits  URINALYSIS, ROUTINE W REFLEX MICROSCOPIC - Abnormal; Notable for the following:    APPearance CLOUDY (*)    Specific Gravity, Urine 1.038 (*)    All other components within normal limits  GC/CHLAMYDIA PROBE AMP  PREGNANCY, URINE  RPR  HIV ANTIBODY (ROUTINE TESTING)    Imaging Review US Transvaginal Non-ob  11/03/2013   CLINICAL DATA:  Right lower quadrant pain sudden-onset. History of ovarian cyst. Previous C-section.  EXAM: TRANSABDOMINAL AND TRANSVAGINAL ULTRASOUND OF PELVIS  TECHNIQUE: Both transabdominal and transvaginal ultrasound examinations of the pelvis were performed. Transabdominal technique was performed for global imaging of the pelvis including uterus, ovaries, adnexal regions, and pelvic cul-de-sac. It was necessary to proceed with endovaginal exam following the transabdominal exam to visualize the endometrium and ovaries.  COMPARISON:  01/27/2013  FINDINGS: Uterus  Measurements: 3.4 x 3.4 x 5.2 cm. No fibroids or other mass visualized.  Endometrium  Thickness: 1.2 mm.  No focal abnormality visualized.  Right ovary  Measurements: 1.0 x 1.1 x 2.7 cm. Normal appearance/no adnexal mass.  Left ovary  Measurements: 1.4 x 2.1 x 2.7 cm. Normal appearance/no adnexal mass.  Other findings  Trace free fluid.   IMPRESSION: Unremarkable pelvic ultrasound.   Electronically Signed   By: Elberta Fortis M.D.   On: 11/03/2013 08:08   US Pelvis Complete  11/03/2013   CLINICAL DATA:  Right lower quadrant pain sudden-onset. History of ovarian cyst. Previous C-section.  EXAM: TRANSABDOMINAL AND TRANSVAGINAL ULTRASOUND OF PELVIS  TECHNIQUE: Both transabdominal and transvaginal ultrasound examinations of the pelvis were performed. Transabdominal technique was performed for global imaging of the pelvis including uterus, ovaries, adnexal regions, and pelvic cul-de-sac. It was necessary to proceed with endovaginal exam following the transabdominal exam to visualize the endometrium and ovaries.  COMPARISON:  01/27/2013  FINDINGS: Uterus  Measurements: 3.4 x 3.4 x 5.2 cm. No fibroids or other mass visualized.  Endometrium  Thickness: 1.2 mm.  No focal abnormality visualized.  Right ovary  Measurements: 1.0 x 1.1 x 2.7 cm. Normal appearance/no adnexal mass.  Left ovary  Measurements: 1.4 x 2.1 x 2.7 cm. Normal appearance/no adnexal mass.  Other findings  Trace free fluid.  IMPRESSION: Unremarkable pelvic ultrasound.   Electronically Signed   By: Elberta Fortisaniel  Boyle M.D.   On: 11/03/2013 08:08   Ct Abdomen Pelvis W Contrast  11/03/2013   CLINICAL DATA:  Right lower quadrant pain  EXAM: CT ABDOMEN AND PELVIS WITH CONTRAST  TECHNIQUE: Multidetector CT imaging of the abdomen and pelvis was performed using the standard protocol following bolus administration of intravenous contrast.  CONTRAST:  80mL OMNIPAQUE IOHEXOL 300 MG/ML  SOLN  COMPARISON:  Ultrasound same day.  CT 11/22/2010  FINDINGS: Lung bases are clear. No pleural or pericardial fluid. The liver has a normal appearance without focal lesions or biliary ductal obstruction. The gallbladder is collapsed and unremarkable. The spleen is normal. The pancreas is normal. The adrenal glands are normal. The kidneys are normal. The aorta and IVC are normal. No retroperitoneal mass or adenopathy.  No free intraperitoneal fluid or air. There is a large amount of fecal matter and gas within the colon. Small bowel gas pattern is normal. The uterus is retroverted but otherwise unremarkable. No evidence of adnexal mass. The cecum is low lying, within the pelvis. I cannot identify the appendix but do not see any abnormal bowel or inflammatory change in that region. Soft tissues of the right groin region appear unremarkable. No bony abnormality.  IMPRESSION: No pathologic finding. The patient does have a fairly large amount of fecal matter and gas throughout the colon. Cecum is low-lying, in the right pelvis. I cannot specifically identified the appendix but do not see any inflamed bowel in that region.   Electronically Signed   By: Paulina FusiMark  Shogry M.D.   On: 11/03/2013 10:08     EKG Interpretation None      11:01 AM Discussed pelvic US with Dr Micheline MazeBoyle re vascular flow. Good color flow to the ovaries and normal appearance.  Very very low suspicion for torsion.  Pt with improved pain.  Abdominal exam unchanged.   Pt noted pain worsened after urinating.     MDM   Final diagnoses:  Right lower quadrant abdominal pain    Afebrile nontoxic patient with RLQ abdominal/pelvic pain that woke her up overnight.  +right inguinal adenopathy.  Pelvic US, CT abd/pelvis remarkable only for large stool burden.  UA negative, wet prep unremarkable.  Labs remarkable only for leukocytosis.  CT did not visualize appendix but no surrounding inflammatory changes. Pt without fever, no vomiting.  Pt has been having urinary urgency - urine to be sent for culture.   Discussed not covering up any change symptoms with pain medication and return for worsening or changing symptoms.  Pain is RLQ, higher than right adnexa.  No doppler performed on pelvic US but discussed with radiologist, very low suspicion for torsion, and per exam suspect this is not the cause of her pain.  D/C home with colace, ibuprofen, norco.  Discussed result,  findings, treatment, and follow up  with patient.  Pt given return precautions.  Pt verbalizes understanding and agrees with plan.       Trixie Dredgemily Iver Fehrenbach, PA-C 11/03/13 1444

## 2013-11-03 NOTE — ED Notes (Signed)
Patient returned from CT

## 2013-11-03 NOTE — Progress Notes (Signed)
[facility] Community Liaison at [facility] [employer] [employer] [place]:  Will be sending patient information on [facility] [employer] [facility] and a list of primary care resources to help patient establish a pcp. Also, will provided pt with information on [facility] [place].

## 2013-11-03 NOTE — ED Notes (Signed)
Patient returned from XRay

## 2013-11-04 LAB — URINE CULTURE
Colony Count: NO GROWTH
Culture: NO GROWTH

## 2013-11-04 LAB — GC/CHLAMYDIA PROBE AMP
CT Probe RNA: NEGATIVE
GC Probe RNA: NEGATIVE

## 2013-11-13 ENCOUNTER — Encounter (HOSPITAL_COMMUNITY): Payer: Self-pay | Admitting: Emergency Medicine

## 2013-11-13 ENCOUNTER — Emergency Department (HOSPITAL_COMMUNITY)
Admission: EM | Admit: 2013-11-13 | Discharge: 2013-11-13 | Disposition: A | Discharge status: Home / Self Care | Payer: BC Managed Care – PPO | Attending: Emergency Medicine | Admitting: Emergency Medicine

## 2013-11-13 DIAGNOSIS — F1410 Cocaine abuse, uncomplicated: Secondary | ICD-10-CM | POA: Insufficient documentation

## 2013-11-13 DIAGNOSIS — Z862 Personal history of diseases of the blood and blood-forming organs and certain disorders involving the immune mechanism: Secondary | ICD-10-CM | POA: Insufficient documentation

## 2013-11-13 DIAGNOSIS — J45909 Unspecified asthma, uncomplicated: Secondary | ICD-10-CM | POA: Insufficient documentation

## 2013-11-13 DIAGNOSIS — Z3202 Encounter for pregnancy test, result negative: Secondary | ICD-10-CM | POA: Insufficient documentation

## 2013-11-13 DIAGNOSIS — F1110 Opioid abuse, uncomplicated: Secondary | ICD-10-CM | POA: Insufficient documentation

## 2013-11-13 DIAGNOSIS — F17200 Nicotine dependence, unspecified, uncomplicated: Secondary | ICD-10-CM | POA: Insufficient documentation

## 2013-11-13 DIAGNOSIS — Z8742 Personal history of other diseases of the female genital tract: Secondary | ICD-10-CM | POA: Insufficient documentation

## 2013-11-13 DIAGNOSIS — F1910 Other psychoactive substance abuse, uncomplicated: Secondary | ICD-10-CM

## 2013-11-13 DIAGNOSIS — Z8659 Personal history of other mental and behavioral disorders: Secondary | ICD-10-CM | POA: Insufficient documentation

## 2013-11-13 DIAGNOSIS — F1210 Cannabis abuse, uncomplicated: Secondary | ICD-10-CM | POA: Insufficient documentation

## 2013-11-13 LAB — CBC WITH DIFFERENTIAL/PLATELET
Basophils Absolute: 0.1 10*3/uL (ref 0.0–0.1)
Basophils Relative: 1 % (ref 0–1)
EOS PCT: 1 % (ref 0–5)
Eosinophils Absolute: 0.1 10*3/uL (ref 0.0–0.7)
HEMATOCRIT: 42 % (ref 36.0–46.0)
HEMOGLOBIN: 14.1 g/dL (ref 12.0–15.0)
LYMPHS ABS: 2.7 10*3/uL (ref 0.7–4.0)
Lymphocytes Relative: 35 % (ref 12–46)
MCH: 27 pg (ref 26.0–34.0)
MCHC: 33.6 g/dL (ref 30.0–36.0)
MCV: 80.5 fL (ref 78.0–100.0)
MONO ABS: 0.7 10*3/uL (ref 0.1–1.0)
Monocytes Relative: 9 % (ref 3–12)
Neutro Abs: 4.2 10*3/uL (ref 1.7–7.7)
Neutrophils Relative %: 54 % (ref 43–77)
Platelets: 267 10*3/uL (ref 150–400)
RBC: 5.22 MIL/uL — AB (ref 3.87–5.11)
RDW: 16.9 % — ABNORMAL HIGH (ref 11.5–15.5)
WBC: 7.8 10*3/uL (ref 4.0–10.5)

## 2013-11-13 LAB — POC URINE PREG, ED: PREG TEST UR: NEGATIVE

## 2013-11-13 LAB — COMPREHENSIVE METABOLIC PANEL
ALT: 8 U/L (ref 0–35)
AST: 15 U/L (ref 0–37)
Albumin: 3.8 g/dL (ref 3.5–5.2)
Alkaline Phosphatase: 99 U/L (ref 39–117)
Anion gap: 12 (ref 5–15)
BILIRUBIN TOTAL: 0.3 mg/dL (ref 0.3–1.2)
BUN: 8 mg/dL (ref 6–23)
CALCIUM: 10.1 mg/dL (ref 8.4–10.5)
CO2: 27 meq/L (ref 19–32)
Chloride: 98 mEq/L (ref 96–112)
Creatinine, Ser: 0.54 mg/dL (ref 0.50–1.10)
GFR calc Af Amer: >90 mL/min (ref >90)
GLUCOSE: 105 mg/dL — AB (ref 70–99)
Potassium: 4.1 mEq/L (ref 3.7–5.3)
Sodium: 137 mEq/L (ref 137–147)
Total Protein: 8.6 g/dL — ABNORMAL HIGH (ref 6.0–8.3)

## 2013-11-13 LAB — RAPID URINE DRUG SCREEN, HOSP PERFORMED
Amphetamines: NOT DETECTED
Barbiturates: NOT DETECTED
Benzodiazepines: NOT DETECTED
Cocaine: POSITIVE — AB
Opiates: POSITIVE — AB
TETRAHYDROCANNABINOL: POSITIVE — AB

## 2013-11-13 LAB — ETHANOL

## 2013-11-13 MED ORDER — METHOCARBAMOL 500 MG PO TABS
500.0000 mg | ORAL_TABLET | Freq: Two times a day (BID) | ORAL | Status: DC
Start: 2013-11-13 — End: 2014-01-11

## 2013-11-13 MED ORDER — ONDANSETRON HCL 4 MG PO TABS: 4.0000 mg | ORAL_TABLET | Freq: Four times a day (QID) | ORAL | Status: DC
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
## 2013-11-13 NOTE — ED Provider Notes (Signed)
Medical screening examination/treatment/procedure(s) were performed by non-physician practitioner and as supervising physician I was immediately available for consultation/collaboration.  Kuron Docken L Leighla Chestnutt, MD 11/13/13 2336 

## 2013-11-13 NOTE — ED Notes (Signed)
Pt states that she is being forced to come here by her parents for drug testing.  States that she is under a lot of stress and has lost her job, her house, and relationship.  States that her parents think she is stealing from them and "it's either this or they are going to send me to jail".  It was explained to the patient that we could not force her to do anything and it was up to her what she wanted to do.  Pt states "might as well get it over with".  Pt states that they want her to get detox from opiates.  Pt denies SI/HI.

## 2013-11-13 NOTE — Discharge Instructions (Signed)
Take the prescribed medication as directed to help with withdrawal sx if they develop. Follow-up with outpatient psychiatry to enroll in an outpatient detox program.  Resources attached on back to help with this. Return to the ED for new or worsening symptoms.   Emergency Department Resource Guide 1) Find a Doctor and Pay Out of Pocket Although you won't have to find out who is covered by your insurance plan, it is a good idea to ask around and get recommendations. You will then need to call the office and see if the doctor you have chosen will accept you as a new patient and what types of options they offer for patients who are self-pay. Some doctors offer discounts or will set up payment plans for their patients who do not have insurance, but you will need to ask so you aren't surprised when you get to your appointment.  2) Contact Your Local Health Department Not all health departments have doctors that can see patients for sick visits, but many do, so it is worth a call to see if yours does. If you don't know where your local health department is, you can check in your phone book. The CDC also has a tool to help you locate your state's health department, and many state websites also have listings of all of their local health departments.  3) Find a Walk-in Clinic If your illness is not likely to be very severe or complicated, you may want to try a walk in clinic. These are popping up all over the country in pharmacies, drugstores, and shopping centers. They're usually staffed by nurse practitioners or physician assistants that have been trained to treat common illnesses and complaints. They're usually fairly quick and inexpensive. However, if you have serious medical issues or chronic medical problems, these are probably not your best option.  No Primary Care Doctor: - Call Health Connect at  [phone] - they can help you locate a primary care doctor that  accepts your insurance, provides certain  services, etc. - Physician Referral Service- [phone]  Chronic Pain Problems: Organization         Address  Phone   Notes  [facility]  [phone] Patients need to be referred by their primary care doctor.   Medication Assistance: Organization         Address  Phone   Notes  [facility] [address] [place], [place] [address] [phone] --Must be a resident of [facility] [place] -- Must have NO insurance coverage whatsoever (no Medicaid/ Medicare, etc.) -- The pt. MUST have a primary care doctor that directs their care regularly and follows them in the community   Med[facility]ist  [phone]   [employer]  [phone]    Agencies that provide inexpensive medical care: Organization         Address  Phone   Notes  [facility]  [phone]   [facility]    [phone]   [facility] [address] [place], [place] [address] [phone]   [facility] [address], [place] [phone]   [facility]    [phone]   [facility]    [phone]   [facility]  [address], [place] Phone:  [phone], Fax:  [phone] Hours of Operation:  9 am - 6 pm, M-F.  Also  accepts Medicaid/Medicare and self-pay.  [facility] for [place], [address], [place] Phone: [phone], Fax: [phone]. Hours of Operation:  8:30 am - 5:30 pm, M-F.  Also accepts Medicaid and self-pay.  [facility] [address], [place] Phone: [phone]   [place], [place], [place] [phone], Ext. [phone] Mondays & Thursdays: 7-9 AM.  First 15 patients are seen on a first come, first serve basis.    [place] Providers:  Organization         Address  Phone   Notes  [facility] [address], [place] [phone] Also accepts self-pay patients.  [facility] [id number] [place], [place]  [phone]   [place], [address], [place] [phone]   [facility] [address] [place] [phone]   Lucianne Lei [address], [place]   [phone] Only accepts [place] Access [place] patients after they have their name applied to their card.   Self-Pay (no insurance) in [facility]:  Organization         Address  Phone   Notes  Sickle Cell Patients, [facility] [place] [phone]   [facility] [place] [phone]   [facility] [place]  [place], [place], [place] [phone]   [facility]/Dr. Osei-Bonsu  [address], [place] or [address], [place] [phone] Phone number for both [place] and [place] locations is the same.  [facility] [address] [place] [phone]   [facility] [address], [place] or [address] [phone]   [facility] [address], [place] [phone], phone; [phone], fax Sees patients 1st and 3rd Saturday of every month.  Must not qualify for public or private insurance (i.e. Medicaid, Medicare, [place] Health Choice, Veterans' Benefits)  Household income should be no more than 200% of the poverty level The clinic cannot treat you if you are pregnant or think you are pregnant  Sexually transmitted diseases are not treated at the clinic.    Dental Care: Organization         Address  Phone  Notes  [facility] [phone] Accepts  children up to age 58 who are enrolled in [place] or [place]; pregnant women with a Medicaid card; and children who have applied for Medicaid or [place] Health Choice, but were declined, whose parents can pay a reduced fee at time of service.  [facility]  [address], [place] [phone] Accepts children up to age 73 who are enrolled in [place] or [place]; pregnant women with a Medicaid card; and children who have applied for Medicaid or [place] Health Choice, but were declined, whose parents can pay a reduced fee at time of service.  [facility] PRO[facility]  [place] [phone] Patients are seen by appointment only. Walk-ins are not accepted. [place] will see patients 13 years of age and older. Monday - Tuesday (8am-5pm)  Most Wednesdays (8:30-5pm) $30 per visit, cash only  [facility] [employer] PROGRAM  [address], [facility] [phone] Patients are seen by appointment only. Walk-ins are not accepted. [place] will see patients 76 years of age and older. One Wednesday Evening (Monthly: Volunteer Based).  $30 per visit, cash only  [place]  [phone] for adults; Children under age 76, call Graduate Pediatric Dentistry at [phone]. Children aged 3-14, please call [phone] to request a pediatric application.  Dental services are provided in all areas of dental care including fillings, crowns and bridges, complete and partial dentures, implants, gum treatment, root canals, and extractions. Preventive care is also provided. Treatment is provided to both adults and children. Patients are selected via a lottery and there is often a waiting list.   [facility] [address] [place]  [phone] [email or web address]   [facility] [address] [place], [place] [phone], Ext. [phone] Second and  Fourth Thursday of each month, opens at 6:30 AM; Clinic ends at 9 AM.  Patients are seen on a first-come first-served basis, and a limited number are seen during each clinic.   [facility]  [address] Hillard Danker [place], [place] [phone]   Eligibility Requirements You must have lived in [place], [place], or [place] counties for at least the last three months.   You cannot be eligible for state or federal sponsored [employer], including [employer], [place], or [employer].   You generally cannot be eligible for healthcare insurance through your employer.    How to apply: Eligibility screenings are held every Tuesday and Wednesday afternoon from 1:00 pm until 4:00 pm. You do not need an appointment for the interview!  [facility] [address], [place], [place]   [place]  [place] Department  Industry  [phone]    Behavioral Health Resources in the Community: Intensive Outpatient Programs Organization         Address  Phone  Notes  [place] Ruth. [address], [place], [place] [phone]   [facility] [address], [place], [place]   ADS: Alcohol & Drug Svcs [address], [place], [place]   [place] [address]  [place], [place] or [phone]   Substance Abuse Resources Organization         Address  Phone  Notes  Alcohol and Drug Services  [phone]   [place]  [phone]   The [place]   Chinita Pester  [phone]   Residential & Outpatient Substance Abuse Program  [phone]   Psychological Services Organization         Address  Phone  Notes  [facility] [place]  [place]  [phone]   [place]  [address], [place] or [phone]    Mobile Crisis Teams Organization         Address  Phone  Notes  [facility], Mobile Crisis Care Unit  [phone]   [facility]  [address] [place], [place]   [place] Levels [address] [place] [phone]    Self-Help/Support Groups Organization         Address  Phone  Notes  Mental Health Assoc. of [place] - variety of support groups  [place] Call for more information  Narcotics Anonymous (NA), [facility] [address], [employer] [place]  2 meetings at this location   Special educational needs teacher         Address  Phone  Notes  [facility] Residential Treatment [place],    [place]  [phone]   [facility]  [address] [place] [address]31, [place], [place]   [place], [place] [phone] Admissions: 8am-3pm M-F  Incentives Substance [place] [address]    [place], [place] [phone]   [facility] [address] [place], [place], [place]   [facility] [address],  [place], [place]   [facility] Programs - Intensive Outpatient [address]een [address], [place], [place]   [facility] [address]  [place], [place] [phone] or [phone]   [facility] (RTS) [address] [place], [place] Accepts Medicaid  [facility] [employer] [address],  [place] [phone] Substance Abuse/Addiction Treatment   [facility] Organization         Address  Phone  Notes  [facility]  [phone]   Domenic Schwab, PhD [address] Arlis Porta [place], [place]   [phone] or [phone]   [place], [place] [phone]   [facility] [address] [place], [place] [phone] Insurance/Medicaid/sponsorship through [facility] and Families [address], [address] [place]                                    [place], [place] [phone] [place] [address], [place] [phone]    Dr. Adele Schilder  [phone]   [facility] Dept. 1) [address] [place] 2) [place] 3)  [place] 65, [place] [phone]  [phone]   [place] [phone] or [phone] (After Hours)

## 2014-01-11 ENCOUNTER — Emergency Department (HOSPITAL_COMMUNITY)
Admission: EM | Admit: 2014-01-11 | Discharge: 2014-01-11 | Disposition: A | Discharge status: Home / Self Care | Payer: BC Managed Care – PPO | Attending: Emergency Medicine | Admitting: Emergency Medicine

## 2014-01-11 ENCOUNTER — Encounter (HOSPITAL_COMMUNITY): Payer: Self-pay | Admitting: Emergency Medicine

## 2014-01-11 ENCOUNTER — Emergency Department (HOSPITAL_COMMUNITY): Payer: BC Managed Care – PPO

## 2014-01-11 DIAGNOSIS — Z8742 Personal history of other diseases of the female genital tract: Secondary | ICD-10-CM | POA: Insufficient documentation

## 2014-01-11 DIAGNOSIS — F419 Anxiety disorder, unspecified: Secondary | ICD-10-CM | POA: Insufficient documentation

## 2014-01-11 DIAGNOSIS — Z720 Tobacco use: Secondary | ICD-10-CM | POA: Insufficient documentation

## 2014-01-11 DIAGNOSIS — D649 Anemia, unspecified: Secondary | ICD-10-CM | POA: Insufficient documentation

## 2014-01-11 DIAGNOSIS — B349 Viral infection, unspecified: Secondary | ICD-10-CM | POA: Insufficient documentation

## 2014-01-11 DIAGNOSIS — Z3202 Encounter for pregnancy test, result negative: Secondary | ICD-10-CM | POA: Insufficient documentation

## 2014-01-11 DIAGNOSIS — R51 Headache: Secondary | ICD-10-CM | POA: Insufficient documentation

## 2014-01-11 DIAGNOSIS — Z8719 Personal history of other diseases of the digestive system: Secondary | ICD-10-CM | POA: Insufficient documentation

## 2014-01-11 DIAGNOSIS — R112 Nausea with vomiting, unspecified: Secondary | ICD-10-CM | POA: Insufficient documentation

## 2014-01-11 DIAGNOSIS — J45909 Unspecified asthma, uncomplicated: Secondary | ICD-10-CM | POA: Insufficient documentation

## 2014-01-11 HISTORY — DX: Unspecified abdominal hernia without obstruction or gangrene: K46.9

## 2014-01-11 LAB — CBC WITH DIFFERENTIAL/PLATELET
BASOS ABS: 0.1 10*3/uL (ref 0.0–0.1)
Basophils Relative: 1 % (ref 0–1)
EOS PCT: 2 % (ref 0–5)
Eosinophils Absolute: 0.3 10*3/uL (ref 0.0–0.7)
HCT: 37.5 % (ref 36.0–46.0)
Hemoglobin: 12.6 g/dL (ref 12.0–15.0)
LYMPHS ABS: 5.1 10*3/uL — AB (ref 0.7–4.0)
LYMPHS PCT: 38 % (ref 12–46)
MCH: 27.2 pg (ref 26.0–34.0)
MCHC: 33.6 g/dL (ref 30.0–36.0)
MCV: 81 fL (ref 78.0–100.0)
MONOS PCT: 7 % (ref 3–12)
Monocytes Absolute: 0.9 10*3/uL (ref 0.1–1.0)
Neutro Abs: 6.9 10*3/uL (ref 1.7–7.7)
Neutrophils Relative %: 52 % (ref 43–77)
PLATELETS: 204 10*3/uL (ref 150–400)
RBC: 4.63 MIL/uL (ref 3.87–5.11)
RDW: 16.2 % — AB (ref 11.5–15.5)
WBC: 13.3 10*3/uL — AB (ref 4.0–10.5)

## 2014-01-11 LAB — BASIC METABOLIC PANEL
Anion gap: 11 (ref 5–15)
BUN: 13 mg/dL (ref 6–23)
CO2: 26 meq/L (ref 19–32)
CREATININE: 0.54 mg/dL (ref 0.50–1.10)
Calcium: 9.2 mg/dL (ref 8.4–10.5)
Chloride: 102 mEq/L (ref 96–112)
GFR calc non Af Amer: >90 mL/min (ref >90)
Glucose, Bld: 94 mg/dL (ref 70–99)
POTASSIUM: 4.3 meq/L (ref 3.7–5.3)
SODIUM: 139 meq/L (ref 137–147)

## 2014-01-11 LAB — URINALYSIS, ROUTINE W REFLEX MICROSCOPIC
BILIRUBIN URINE: NEGATIVE
Glucose, UA: NEGATIVE mg/dL
Hgb urine dipstick: NEGATIVE
Ketones, ur: NEGATIVE mg/dL
LEUKOCYTES UA: NEGATIVE
Nitrite: NEGATIVE
PH: 6 (ref 5.0–8.0)
Protein, ur: NEGATIVE mg/dL
SPECIFIC GRAVITY, URINE: 1.015 (ref 1.005–1.030)
Urobilinogen, UA: 0.2 mg/dL (ref 0.0–1.0)

## 2014-01-11 LAB — PREGNANCY, URINE: PREG TEST UR: NEGATIVE

## 2014-01-11 MED ORDER — SODIUM CHLORIDE 0.9 % IV BOLUS (SEPSIS)
1000.0000 mL | Freq: Once | INTRAVENOUS | Status: AC
  Administered 2014-01-11: 1000 mL via INTRAVENOUS

## 2014-01-11 MED ORDER — ONDANSETRON HCL 4 MG/2ML IJ SOLN
4.0000 mg | Freq: Once | INTRAMUSCULAR | Status: DC
Start: 2014-01-11 — End: 2014-01-11
  Filled 2014-01-11: qty 2

## 2014-01-11 NOTE — ED Notes (Signed)
Pt reports mild photophobia.

## 2014-01-11 NOTE — ED Provider Notes (Signed)
CSN: [id number]     Arrival date & time 01/11/14  0459 History   First MD Initiated Contact with Patient 01/11/14 0557     Chief Complaint  Patient presents with  . Headache  . Fever  . Chills     (Consider location/radiation/quality/duration/timing/severity/associated sxs/prior Treatment) Patient is a 33 y.o. female presenting with headaches and fever. The history is provided by the patient. No language interpreter was used.  Headache Pain location:  L parietal, R parietal and frontal Onset quality:  Gradual Duration:  1 week Associated symptoms: congestion, fever, myalgias, nausea, sore throat and vomiting   Associated symptoms: no abdominal pain, no cough and no diarrhea   Associated symptoms comment:  Symptoms that started as isolated headache located in right frontal and bilateral parietal areas one week ago, and progressed through the week to include generalized body aches, persistent nausea and fever. No rash. She has vomiting once 5 days ago. She is able to eat and drink without difficulty. No sick contacts.  Fever Associated symptoms: chills, congestion, headaches, myalgias, nausea, sore throat and vomiting   Associated symptoms: no chest pain, no cough, no diarrhea, no dysuria and no rash     Past Medical History  Diagnosis Date  . Smoker     5 CIGS A DAY  . Ovarian cyst   . Cervicitis   . Asthma     as child, no prob as adult, no inhaler  . Anemia     history with pregnancy, no current prob  . Anxiety     no meds  . History of ovarian cystectomy   . Abdominal hernia    Past Surgical History  Procedure Laterality Date  . Cervical biopsy  w/ loop electrode excision  02/2000  . Cesarean section  02/2001  . Wisdom tooth extraction    . Laparoscopy  04/24/2011    Procedure: LAPAROSCOPY OPERATIVE;  Surgeon: Ok Edwards, MD;  Location: [facility];  Service: Gynecology;  Laterality: N/A;  pelvic washings  . Ovarian cyst removal  04/24/2011    Procedure: OVARIAN  CYSTECTOMY;  Surgeon: Ok Edwards, MD;  Location: [facility];  Service: Gynecology;  Laterality: Left;  [place] Kitchen Tympanostomy tube placement     Family History  Problem Relation Age of Onset  . Adopted: Yes  . Hypertension Father   . Cancer Maternal Grandmother     OVARIAN  . Breast cancer Maternal Grandmother   . Heart disease Maternal Grandmother   . Heart disease Maternal Grandfather   . Heart disease Paternal Grandmother   . Heart disease Paternal Grandfather    History  Substance Use Topics  . Smoking status: Current Every Day Smoker -- 0.25 packs/day for 12 years    Types: Cigarettes  . Smokeless tobacco: Never Used  . Alcohol Use: Yes     Comment: ocassionally   OB History   Grav Para Term Preterm Abortions TAB SAB Ect Mult Living   1 1 1       1      Review of Systems  Constitutional: Positive for fever and chills. Negative for appetite change.  HENT: Positive for congestion and sore throat.   Eyes: Negative.  Negative for visual disturbance.  Respiratory: Negative.  Negative for cough and shortness of breath.   Cardiovascular: Negative.  Negative for chest pain.  Gastrointestinal: Positive for nausea and vomiting. Negative for abdominal pain and diarrhea.  Genitourinary: Negative.  Negative for dysuria and vaginal discharge.  Musculoskeletal: Positive for myalgias.  Skin: Negative.  Negative for rash.  Neurological: Positive for headaches. Negative for weakness and light-headedness.      Allergies  Benzoyl peroxide and Other  Home Medications   Prior to Admission medications   Medication Sig Start Date End Date Taking? Authorizing Provider  acetaminophen (TYLENOL) 500 MG tablet Take 1,000 mg by mouth every 6 (six) hours as needed for mild pain or fever.   Yes Historical Provider, MD  ferrous fumarate (HEMOCYTE - 106 MG FE) 325 (106 FE) MG TABS tablet Take 1 tablet by mouth daily.   Yes Historical Provider, MD  ibuprofen (ADVIL,MOTRIN) 200 MG tablet Take 800 mg by  mouth every 6 (six) hours as needed for fever or moderate pain.    Yes Historical Provider, MD  QUEtiapine Fumarate (SEROQUEL XR) 150 MG 24 hr tablet Take 150 mg by mouth at bedtime.   Yes Historical Provider, MD   BP 116/79  Pulse 88  Temp(Src) 98 F (36.7 C) (Oral)  Resp 16  Ht 5\' 5"  (1.651 m)  Wt 120 lb (54.432 kg)  BMI 19.97 kg/m2  SpO2 100% Physical Exam  Constitutional: She is oriented to person, place, and time. She appears well-developed and well-nourished.  HENT:  Head: Normocephalic.  Neck: Normal range of motion. Neck supple.  Cardiovascular: Normal rate and regular rhythm.   Pulmonary/Chest: Effort normal and breath sounds normal.  Abdominal: Soft. Bowel sounds are normal. There is tenderness. There is no rebound and no guarding.  Small, soft left inguinal mass that is tender consistent with known inguinal hernia.   Musculoskeletal: Normal range of motion.  Neurological: She is alert and oriented to person, place, and time.  Skin: Skin is warm and dry. No rash noted.  Psychiatric: She has a normal mood and affect.    ED Course  Procedures (including critical care time) Labs Review Labs Reviewed  CBC WITH DIFFERENTIAL  BASIC METABOLIC PANEL  URINALYSIS, ROUTINE W REFLEX MICROSCOPIC  PREGNANCY, URINE   Results for orders placed during the hospital encounter of 01/11/14  CBC WITH DIFFERENTIAL      Result Value Ref Range   WBC 13.3 (*) 4.0 - 10.5 K/uL   RBC 4.63  3.87 - 5.11 MIL/uL   Hemoglobin 12.6  12.0 - 15.0 g/dL   HCT 78.237.5  95.636.0 - 21.346.0 %   MCV 81.0  78.0 - 100.0 fL   MCH 27.2  26.0 - 34.0 pg   MCHC 33.6  30.0 - 36.0 g/dL   RDW 08.616.2 (*) 57.811.5 - 46.915.5 %   Platelets 204  150 - 400 K/uL   Neutrophils Relative % 52  43 - 77 %   Lymphocytes Relative 38  12 - 46 %   Monocytes Relative 7  3 - 12 %   Eosinophils Relative 2  0 - 5 %   Basophils Relative 1  0 - 1 %   Neutro Abs 6.9  1.7 - 7.7 K/uL   Lymphs Abs 5.1 (*) 0.7 - 4.0 K/uL   Monocytes Absolute 0.9  0.1  - 1.0 K/uL   Eosinophils Absolute 0.3  0.0 - 0.7 K/uL   Basophils Absolute 0.1  0.0 - 0.1 K/uL   WBC Morphology TOXIC GRANULATION    BASIC METABOLIC PANEL      Result Value Ref Range   Sodium 139  137 - 147 mEq/L   Potassium 4.3  3.7 - 5.3 mEq/L   Chloride 102  96 - 112 mEq/L   CO2 26  19 -  32 mEq/L   Glucose, Bld 94  70 - 99 mg/dL   BUN 13  6 - 23 mg/dL   Creatinine, Ser 1.61  0.50 - 1.10 mg/dL   Calcium 9.2  8.4 - 09.6 mg/dL   GFR calc non Af Amer >90  >90 mL/min   GFR calc Af Amer >90  >90 mL/min   Anion gap 11  5 - 15  URINALYSIS, ROUTINE W REFLEX MICROSCOPIC      Result Value Ref Range   Color, Urine YELLOW  YELLOW   APPearance CLEAR  CLEAR   Specific Gravity, Urine 1.015  1.005 - 1.030   pH 6.0  5.0 - 8.0   Glucose, UA NEGATIVE  NEGATIVE mg/dL   Hgb urine dipstick NEGATIVE  NEGATIVE   Bilirubin Urine NEGATIVE  NEGATIVE   Ketones, ur NEGATIVE  NEGATIVE mg/dL   Protein, ur NEGATIVE  NEGATIVE mg/dL   Urobilinogen, UA 0.2  0.0 - 1.0 mg/dL   Nitrite NEGATIVE  NEGATIVE   Leukocytes, UA NEGATIVE  NEGATIVE  PREGNANCY, URINE      Result Value Ref Range   Preg Test, Ur NEGATIVE  NEGATIVE   Dg Chest 2 View  01/11/2014   CLINICAL DATA:  Initial evaluation for headache, fever, chills  EXAM: CHEST  2 VIEW  COMPARISON:  Prior radiograph from 07/09/2011  FINDINGS: The cardiac and mediastinal silhouettes are stable in size and contour, and remain within normal limits.  The lungs are normally inflated. No airspace consolidation, pleural effusion, or pulmonary edema is identified. There is no pneumothorax.  No acute osseous abnormality identified.  IMPRESSION: No active cardiopulmonary disease.   Electronically Signed   By: Rise Mu M.D.   On: 01/11/2014 06:56    Imaging Review No results found.   EKG Interpretation None      MDM   Final diagnoses:  None    1. Viral syndrome  She is well appearing, non-toxic. No meningeal signs. VSS. She feels improved with IV  fluids. No concerning lab studies. Presentation consistent with viral illness. Return precautions discussed.       Arnoldo Hooker, PA-C 01/11/14 0454  Arnoldo Hooker, PA-C 01/11/14 [phone]

## 2014-01-11 NOTE — Discharge Instructions (Signed)

## 2014-01-11 NOTE — ED Notes (Signed)
Pt states that she has had a headache since last Monday with fevers as high 103.9 and chills and neck stiffness.

## 2014-01-14 NOTE — ED Provider Notes (Signed)
Medical screening examination/treatment/procedure(s) were performed by non-physician practitioner and as supervising physician I was immediately available for consultation/collaboration.   EKG Interpretation None       Hallis Meditz, MD 01/14/14 0108 

## 2014-02-07 ENCOUNTER — Encounter (HOSPITAL_COMMUNITY): Payer: Self-pay | Admitting: Emergency Medicine

## 2014-03-06 ENCOUNTER — Emergency Department (HOSPITAL_COMMUNITY): Payer: Self-pay

## 2014-03-06 ENCOUNTER — Emergency Department (HOSPITAL_COMMUNITY)
Admission: EM | Admit: 2014-03-06 | Discharge: 2014-03-06 | Disposition: A | Discharge status: Home / Self Care | Payer: Self-pay | Attending: Emergency Medicine | Admitting: Emergency Medicine

## 2014-03-06 ENCOUNTER — Encounter (HOSPITAL_COMMUNITY): Payer: Self-pay | Admitting: *Deleted

## 2014-03-06 DIAGNOSIS — R1032 Left lower quadrant pain: Secondary | ICD-10-CM | POA: Insufficient documentation

## 2014-03-06 DIAGNOSIS — R000 Tachycardia, unspecified: Secondary | ICD-10-CM | POA: Insufficient documentation

## 2014-03-06 DIAGNOSIS — R52 Pain, unspecified: Secondary | ICD-10-CM

## 2014-03-06 DIAGNOSIS — Z720 Tobacco use: Secondary | ICD-10-CM | POA: Insufficient documentation

## 2014-03-06 DIAGNOSIS — Z9889 Other specified postprocedural states: Secondary | ICD-10-CM | POA: Insufficient documentation

## 2014-03-06 DIAGNOSIS — R109 Unspecified abdominal pain: Secondary | ICD-10-CM

## 2014-03-06 DIAGNOSIS — Z8719 Personal history of other diseases of the digestive system: Secondary | ICD-10-CM | POA: Insufficient documentation

## 2014-03-06 DIAGNOSIS — Z3202 Encounter for pregnancy test, result negative: Secondary | ICD-10-CM | POA: Insufficient documentation

## 2014-03-06 DIAGNOSIS — Z79899 Other long term (current) drug therapy: Secondary | ICD-10-CM | POA: Insufficient documentation

## 2014-03-06 DIAGNOSIS — Z8742 Personal history of other diseases of the female genital tract: Secondary | ICD-10-CM | POA: Insufficient documentation

## 2014-03-06 DIAGNOSIS — D649 Anemia, unspecified: Secondary | ICD-10-CM | POA: Insufficient documentation

## 2014-03-06 DIAGNOSIS — J45909 Unspecified asthma, uncomplicated: Secondary | ICD-10-CM | POA: Insufficient documentation

## 2014-03-06 DIAGNOSIS — Z8659 Personal history of other mental and behavioral disorders: Secondary | ICD-10-CM | POA: Insufficient documentation

## 2014-03-06 LAB — URINALYSIS, ROUTINE W REFLEX MICROSCOPIC
BILIRUBIN URINE: NEGATIVE
Glucose, UA: NEGATIVE mg/dL
Hgb urine dipstick: NEGATIVE
KETONES UR: NEGATIVE mg/dL
Leukocytes, UA: NEGATIVE
NITRITE: NEGATIVE
PROTEIN: NEGATIVE mg/dL
SPECIFIC GRAVITY, URINE: 1.008 (ref 1.005–1.030)
Urobilinogen, UA: 1 mg/dL (ref 0.0–1.0)
pH: 8 (ref 5.0–8.0)

## 2014-03-06 LAB — COMPREHENSIVE METABOLIC PANEL
ALBUMIN: 3.6 g/dL (ref 3.5–5.2)
ALT: 13 U/L (ref 0–35)
AST: 17 U/L (ref 0–37)
Alkaline Phosphatase: 107 U/L (ref 39–117)
Anion gap: 17 — ABNORMAL HIGH (ref 5–15)
BUN: 5 mg/dL — ABNORMAL LOW (ref 6–23)
CO2: 24 mEq/L (ref 19–32)
Calcium: 9.6 mg/dL (ref 8.4–10.5)
Chloride: 98 mEq/L (ref 96–112)
Creatinine, Ser: 0.47 mg/dL — ABNORMAL LOW (ref 0.50–1.10)
GFR calc Af Amer: >90 mL/min (ref >90)
Glucose, Bld: 113 mg/dL — ABNORMAL HIGH (ref 70–99)
Potassium: 4.1 mEq/L (ref 3.7–5.3)
Sodium: 139 mEq/L (ref 137–147)
Total Bilirubin: 0.3 mg/dL (ref 0.3–1.2)
Total Protein: 8.2 g/dL (ref 6.0–8.3)

## 2014-03-06 LAB — CBC WITH DIFFERENTIAL/PLATELET
BASOS PCT: 0 % (ref 0–1)
Basophils Absolute: 0.1 10*3/uL (ref 0.0–0.1)
Eosinophils Absolute: 0.1 10*3/uL (ref 0.0–0.7)
Eosinophils Relative: 1 % (ref 0–5)
HCT: 42.2 % (ref 36.0–46.0)
Hemoglobin: 13.8 g/dL (ref 12.0–15.0)
Lymphocytes Relative: 9 % — ABNORMAL LOW (ref 12–46)
Lymphs Abs: 1.6 10*3/uL (ref 0.7–4.0)
MCH: 26 pg (ref 26.0–34.0)
MCHC: 32.7 g/dL (ref 30.0–36.0)
MCV: 79.6 fL (ref 78.0–100.0)
MONO ABS: 0.5 10*3/uL (ref 0.1–1.0)
Monocytes Relative: 3 % (ref 3–12)
NEUTROS ABS: 16.4 10*3/uL — AB (ref 1.7–7.7)
Neutrophils Relative %: 87 % — ABNORMAL HIGH (ref 43–77)
Platelets: 394 10*3/uL (ref 150–400)
RBC: 5.3 MIL/uL — AB (ref 3.87–5.11)
RDW: 15.7 % — AB (ref 11.5–15.5)
WBC: 18.6 10*3/uL — AB (ref 4.0–10.5)

## 2014-03-06 LAB — POC URINE PREG, ED: PREG TEST UR: NEGATIVE

## 2014-03-06 MED ORDER — HYDROMORPHONE HCL 1 MG/ML IJ SOLN
1.0000 mg | Freq: Once | INTRAMUSCULAR | Status: AC
  Administered 2014-03-06: 1 mg via INTRAVENOUS
  Filled 2014-03-06: qty 1

## 2014-03-06 MED ORDER — IOHEXOL 300 MG/ML  SOLN
100.0000 mL | Freq: Once | INTRAMUSCULAR | Status: AC | PRN
  Administered 2014-03-06: 100 mL via INTRAVENOUS

## 2014-03-06 MED ORDER — HYDROCODONE-ACETAMINOPHEN 5-325 MG PO TABS: 2.0000 | ORAL_TABLET | ORAL | Status: DC | PRN

## 2014-03-06 MED ORDER — FENTANYL CITRATE 0.05 MG/ML IJ SOLN
50.0000 ug | Freq: Once | INTRAMUSCULAR | Status: AC
  Administered 2014-03-06: 50 ug via INTRAVENOUS
  Filled 2014-03-06: qty 2

## 2014-03-06 MED ORDER — IOHEXOL 300 MG/ML  SOLN
25.0000 mL | INTRAMUSCULAR | Status: AC | PRN
  Administered 2014-03-06 (×2): 25 mL via ORAL
  Filled 2014-03-06 (×2): qty 30

## 2014-03-06 MED ORDER — SODIUM CHLORIDE 0.9 % IV BOLUS (SEPSIS)
1000.0000 mL | Freq: Once | INTRAVENOUS | Status: AC
  Administered 2014-03-06: 1000 mL via INTRAVENOUS

## 2014-03-06 MED ORDER — KETOROLAC TROMETHAMINE 30 MG/ML IJ SOLN
30.0000 mg | Freq: Once | INTRAMUSCULAR | Status: AC
  Administered 2014-03-06: 30 mg via INTRAVENOUS
  Filled 2014-03-06: qty 1

## 2014-03-06 MED ORDER — SODIUM CHLORIDE 0.9 % IV BOLUS (SEPSIS)
500.0000 mL | Freq: Once | INTRAVENOUS | Status: AC
  Administered 2014-03-06: 500 mL via INTRAVENOUS

## 2014-03-06 MED ORDER — ONDANSETRON HCL 4 MG/2ML IJ SOLN
4.0000 mg | Freq: Once | INTRAMUSCULAR | Status: AC
  Administered 2014-03-06: 4 mg via INTRAVENOUS
  Filled 2014-03-06: qty 2

## 2014-03-06 NOTE — Discharge Instructions (Signed)

## 2014-03-06 NOTE — ED Provider Notes (Signed)
CSN: [id number]     Arrival date & time 03/06/14  0356 History   First MD Initiated Contact with Patient 03/06/14 0459     Chief Complaint  Patient presents with  . Abdominal Pain     (Consider location/radiation/quality/duration/timing/severity/associated sxs/prior Treatment) HPI Comments: The patient is a 33 year old female, she has a history of ovarian cysts in the past, she started having pain 2 days ago, it is left-sided, gradually worsening and is now severe. She denies any history of urinary symptoms, diarrhea, blood in the stool or hematuria. She has no fevers or chills, she is in severe pain at this time.  Patient is a 33 y.o. female presenting with abdominal pain. The history is provided by the patient.  Abdominal Pain   Past Medical History  Diagnosis Date  . Smoker     5 CIGS A DAY  . Ovarian cyst   . Cervicitis   . Asthma     as child, no prob as adult, no inhaler  . Anemia     history with pregnancy, no current prob  . Anxiety     no meds  . History of ovarian cystectomy   . Abdominal hernia    Past Surgical History  Procedure Laterality Date  . Cervical biopsy  w/ loop electrode excision  02/2000  . Cesarean section  02/2001  . Wisdom tooth extraction    . Laparoscopy  04/24/2011    Procedure: LAPAROSCOPY OPERATIVE;  Surgeon: Ok EdwardsJuan H Fernandez, MD;  Location: [facility];  Service: Gynecology;  Laterality: N/A;  pelvic washings  . Ovarian cyst removal  04/24/2011    Procedure: OVARIAN CYSTECTOMY;  Surgeon: Ok EdwardsJuan H Fernandez, MD;  Location: [facility];  Service: Gynecology;  Laterality: Left;  Marland Kitchen. Tympanostomy tube placement     Family History  Problem Relation Age of Onset  . Adopted: Yes  . Hypertension Father   . Cancer Maternal Grandmother     OVARIAN  . Breast cancer Maternal Grandmother   . Heart disease Maternal Grandmother   . Heart disease Maternal Grandfather   . Heart disease Paternal Grandmother   . Heart disease Paternal Grandfather    History   Substance Use Topics  . Smoking status: Current Every Day Smoker -- 0.25 packs/day for 12 years    Types: Cigarettes  . Smokeless tobacco: Never Used  . Alcohol Use: Yes     Comment: ocassionally   OB History    Gravida Para Term Preterm AB TAB SAB Ectopic Multiple Living   1 1 1       1      Review of Systems  Gastrointestinal: Positive for abdominal pain.  All other systems reviewed and are negative.     Allergies  Benzoyl peroxide and Other  Home Medications   Prior to Admission medications   Medication Sig Start Date End Date Taking? Authorizing Provider  acetaminophen (TYLENOL) 500 MG tablet Take 1,000 mg by mouth every 6 (six) hours as needed for mild pain or fever.    Historical Provider, MD  ferrous fumarate (HEMOCYTE - 106 MG FE) 325 (106 FE) MG TABS tablet Take 1 tablet by mouth daily.    Historical Provider, MD  ibuprofen (ADVIL,MOTRIN) 200 MG tablet Take 800 mg by mouth every 6 (six) hours as needed for fever or moderate pain.     Historical Provider, MD  QUEtiapine Fumarate (SEROQUEL XR) 150 MG 24 hr tablet Take 150 mg by mouth at bedtime.    Historical Provider, MD  BP 124/86 mmHg  Pulse 108  Temp(Src) 98.4 F (36.9 C) (Oral)  Resp 24  SpO2 98% Physical Exam  Constitutional: She appears well-developed and well-nourished. She appears distressed.  HENT:  Head: Normocephalic and atraumatic.  Mouth/Throat: Oropharynx is clear and moist. No oropharyngeal exudate.  Eyes: Conjunctivae and EOM are normal. Pupils are equal, round, and reactive to light. Right eye exhibits no discharge. Left eye exhibits no discharge. No scleral icterus.  Neck: Normal range of motion. Neck supple. No JVD present. No thyromegaly present.  Cardiovascular: Normal rate, regular rhythm, normal heart sounds and intact distal pulses.  Exam reveals no gallop and no friction rub.   No murmur heard. Pulmonary/Chest: Effort normal and breath sounds normal. No respiratory distress. She has no  wheezes. She has no rales.  Abdominal: Soft. Bowel sounds are normal. She exhibits no distension and no mass. There is tenderness (focal tenderness to the left lower quadrant with mild guarding, no pain at McBurney's point).  Musculoskeletal: Normal range of motion. She exhibits no edema or tenderness.  Lymphadenopathy:    She has no cervical adenopathy.  Neurological: She is alert. Coordination normal.  Skin: Skin is warm and dry. No rash noted. No erythema.  Psychiatric: She has a normal mood and affect. Her behavior is normal.  Nursing note and vitals reviewed.   ED Course  Procedures (including critical care time) Labs Review Labs Reviewed  CBC WITH DIFFERENTIAL - Abnormal; Notable for the following:    WBC 18.6 (*)    RBC 5.30 (*)    RDW 15.7 (*)    Neutrophils Relative % 87 (*)    Neutro Abs 16.4 (*)    Lymphocytes Relative 9 (*)    All other components within normal limits  COMPREHENSIVE METABOLIC PANEL - Abnormal; Notable for the following:    Glucose, Bld 113 (*)    BUN 5 (*)    Creatinine, Ser 0.47 (*)    Anion gap 17 (*)    All other components within normal limits  URINALYSIS, ROUTINE W REFLEX MICROSCOPIC  POC URINE PREG, ED    Imaging Review No results found.    MDM   Final diagnoses:  Abdominal pain  Abdominal pain    The patient appears uncomfortable, she is mildly tachycardic, there is no fever, no hypotension and a bedside ultrasound reveals no hydronephrosis. White blood cell count is significantly elevated, urinalysis pending, bolus, pain medication, CT scan.  Change of shift - care signed out to oncoming EDP - Pollina  Vida RollerBrian D Hektor Huston, MD 03/07/14 [phone]

## 2014-03-06 NOTE — ED Notes (Signed)
Pt is here with LLQ pain since yesterday and now in severe pain.  History of ovarian cyst.  LMP a couple weeks ago.  Pt is nauseated.

## 2014-03-06 NOTE — ED Provider Notes (Signed)
Patient signed out to me by Dr. Hyacinth MeekerMiller to follow-up on CT scan. Patient seen primarily for left-sided lower abdominal and flank pain. Symptoms began 2 days ago and have progressively worsened. CT scan was performed and did not show any acute abnormality. Patient not experiencing any urinary symptoms. She denies any vaginal discharge or bleeding. As the CT scan did not show any acute abnormality, I did perform ultrasound of pelvis including Doppler to rule out torsion and other pathology. This was also unremarkable. At this point no identified cause of the patient's pain is present. She will follow-up with her OB/GYN. She was provided analgesia.  Gilda Creasehristopher J. Nadyne Gariepy, MD 03/06/14 [phone]

## 2014-03-06 NOTE — ED Notes (Signed)
Patient transported to CT 

## 2014-04-29 ENCOUNTER — Emergency Department (HOSPITAL_COMMUNITY): Payer: Self-pay

## 2014-04-29 ENCOUNTER — Encounter (HOSPITAL_COMMUNITY): Payer: Self-pay

## 2014-04-29 ENCOUNTER — Emergency Department (HOSPITAL_COMMUNITY)
Admission: EM | Admit: 2014-04-29 | Discharge: 2014-04-29 | Disposition: A | Discharge status: Home / Self Care | Payer: Self-pay | Attending: Emergency Medicine | Admitting: Emergency Medicine

## 2014-04-29 DIAGNOSIS — R52 Pain, unspecified: Secondary | ICD-10-CM

## 2014-04-29 DIAGNOSIS — Z906 Acquired absence of other parts of urinary tract: Secondary | ICD-10-CM | POA: Insufficient documentation

## 2014-04-29 DIAGNOSIS — Z9889 Other specified postprocedural states: Secondary | ICD-10-CM | POA: Insufficient documentation

## 2014-04-29 DIAGNOSIS — J45909 Unspecified asthma, uncomplicated: Secondary | ICD-10-CM | POA: Insufficient documentation

## 2014-04-29 DIAGNOSIS — R1084 Generalized abdominal pain: Secondary | ICD-10-CM | POA: Insufficient documentation

## 2014-04-29 DIAGNOSIS — F419 Anxiety disorder, unspecified: Secondary | ICD-10-CM | POA: Insufficient documentation

## 2014-04-29 DIAGNOSIS — Z87448 Personal history of other diseases of urinary system: Secondary | ICD-10-CM | POA: Insufficient documentation

## 2014-04-29 DIAGNOSIS — Z3202 Encounter for pregnancy test, result negative: Secondary | ICD-10-CM | POA: Insufficient documentation

## 2014-04-29 LAB — WET PREP, GENITAL
Clue Cells Wet Prep HPF POC: NONE SEEN
Trich, Wet Prep: NONE SEEN
Yeast Wet Prep HPF POC: NONE SEEN

## 2014-04-29 LAB — URINE MICROSCOPIC-ADD ON

## 2014-04-29 LAB — URINALYSIS, ROUTINE W REFLEX MICROSCOPIC
Bilirubin Urine: NEGATIVE
GLUCOSE, UA: NEGATIVE mg/dL
Hgb urine dipstick: NEGATIVE
KETONES UR: NEGATIVE mg/dL
NITRITE: NEGATIVE
PH: 5.5 (ref 5.0–8.0)
PROTEIN: NEGATIVE mg/dL
SPECIFIC GRAVITY, URINE: 1.026 (ref 1.005–1.030)
UROBILINOGEN UA: 0.2 mg/dL (ref 0.0–1.0)

## 2014-04-29 LAB — PREGNANCY, URINE: Preg Test, Ur: NEGATIVE

## 2014-04-29 MED ORDER — HYDROCODONE-ACETAMINOPHEN 5-325 MG PO TABS: 2.0000 | ORAL_TABLET | ORAL | Status: DC | PRN

## 2014-04-29 MED ORDER — IBUPROFEN 800 MG PO TABS: 800.0000 mg | ORAL_TABLET | Freq: Three times a day (TID) | ORAL | Status: DC

## 2014-04-29 NOTE — Discharge Instructions (Signed)
Abdominal Pain, Women °Abdominal (stomach, pelvic, or belly) pain can be caused by many things. It is important to tell your doctor: °· The location of the pain. °· Does it come and go or is it present all the time? °· Are there things that start the pain (eating certain foods, exercise)? °· Are there other symptoms associated with the pain (fever, nausea, vomiting, diarrhea)? °All of this is helpful to know when trying to find the cause of the pain. °CAUSES  °· Stomach: virus or bacteria infection, or ulcer. °· Intestine: appendicitis (inflamed appendix), regional ileitis (Crohn's disease), ulcerative colitis (inflamed colon), irritable bowel syndrome, diverticulitis (inflamed diverticulum of the colon), or cancer of the stomach or intestine. °· Gallbladder disease or stones in the gallbladder. °· Kidney disease, kidney stones, or infection. °· Pancreas infection or cancer. °· Fibromyalgia (pain disorder). °· Diseases of the female organs: °¨ Uterus: fibroid (non-cancerous) tumors or infection. °¨ Fallopian tubes: infection or tubal pregnancy. °¨ Ovary: cysts or tumors. °¨ Pelvic adhesions (scar tissue). °¨ Endometriosis (uterus lining tissue growing in the pelvis and on the pelvic organs). °¨ Pelvic congestion syndrome (female organs filling up with blood just before the menstrual period). °¨ Pain with the menstrual period. °¨ Pain with ovulation (producing an egg). °¨ Pain with an IUD (intrauterine device, birth control) in the uterus. °¨ Cancer of the female organs. °· Functional pain (pain not caused by a disease, may improve without treatment). °· Psychological pain. °· Depression. °DIAGNOSIS  °Your doctor will decide the seriousness of your pain by doing an examination. °· Blood tests. °· X-rays. °· Ultrasound. °· CT scan (computed tomography, special type of X-ray). °· MRI (magnetic resonance imaging). °· Cultures, for infection. °· Barium enema (dye inserted in the large intestine, to better view it with  X-rays). °· Colonoscopy (looking in intestine with a lighted tube). °· Laparoscopy (minor surgery, looking in abdomen with a lighted tube). °· Major abdominal exploratory surgery (looking in abdomen with a large incision). °TREATMENT  °The treatment will depend on the cause of the pain.  °· Many cases can be observed and treated at home. °· Over-the-counter medicines recommended by your caregiver. °· Prescription medicine. °· Antibiotics, for infection. °· Birth control pills, for painful periods or for ovulation pain. °· Hormone treatment, for endometriosis. °· Nerve blocking injections. °· Physical therapy. °· Antidepressants. °· Counseling with a psychologist or psychiatrist. °· Minor or major surgery. °HOME CARE INSTRUCTIONS  °· Do not take laxatives, unless directed by your caregiver. °· Take over-the-counter pain medicine only if ordered by your caregiver. Do not take aspirin because it can cause an upset stomach or bleeding. °· Try a clear liquid diet (broth or water) as ordered by your caregiver. Slowly move to a bland diet, as tolerated, if the pain is related to the stomach or intestine. °· Have a thermometer and take your temperature several times a day, and record it. °· Bed rest and sleep, if it helps the pain. °· Avoid sexual intercourse, if it causes pain. °· Avoid stressful situations. °· Keep your follow-up appointments and tests, as your caregiver orders. °· If the pain does not go away with medicine or surgery, you may try: °¨ Acupuncture. °¨ Relaxation exercises (yoga, meditation). °¨ Group therapy. °¨ Counseling. °SEEK MEDICAL CARE IF:  °· You notice certain foods cause stomach pain. °· Your home care treatment is not helping your pain. °· You need stronger pain medicine. °· You want your IUD removed. °· You feel faint or   lightheaded. °· You develop nausea and vomiting. °· You develop a rash. °· You are having side effects or an allergy to your medicine. °SEEK IMMEDIATE MEDICAL CARE IF:  °· Your  pain does not go away or gets worse. °· You have a fever. °· Your pain is felt only in portions of the abdomen. The right side could possibly be appendicitis. The left lower portion of the abdomen could be colitis or diverticulitis. °· You are passing blood in your stools (bright red or black tarry stools, with or without vomiting). °· You have blood in your urine. °· You develop chills, with or without a fever. °· You pass out. °MAKE SURE YOU:  °· Understand these instructions. °· Will watch your condition. °· Will get help right away if you are not doing well or get worse. °Document Released: 01/20/2007 Document Revised: 08/09/2013 Document Reviewed: 02/09/2009 °[employer]® Patient Information ©2015 [employer]. This information is not intended to replace advice given to you by your health care provider. Make sure you discuss any questions you have with your health care provider. ° °

## 2014-04-29 NOTE — ED Provider Notes (Signed)
CSN: [id number]     Arrival date & time 04/29/14  1005 History   First MD Initiated Contact with Patient 04/29/14 1009     Chief Complaint  Patient presents with  . Abdominal Pain  . Rash     (Consider location/radiation/quality/duration/timing/severity/associated sxs/prior Treatment) Patient is a 34 y.o. female presenting with abdominal pain and rash. The history is provided by the patient. No language interpreter was used.  Abdominal Pain Pain location:  Suprapubic Pain quality: aching and fullness   Pain radiates to:  Does not radiate Pain severity:  Moderate Onset quality:  Gradual Duration:  10 days Timing:  Constant Progression:  Worsening Chronicity:  New Relieved by:  Nothing Worsened by:  Nothing tried Ineffective treatments:  None tried Associated symptoms: no nausea, no vaginal bleeding and no vaginal discharge   Rash Associated symptoms: abdominal pain   Associated symptoms: no nausea   Pt has a history of ovarian cyst.  Pt has had a cyst removed from left side.   Pt currently has discomfort with urination.   Pt also reports a lump in left lower abdomen that comes and goes.  Lump is at incision line. Pt reports sometimes area is there and other times she does not feel anything  Past Medical History  Diagnosis Date  . Smoker     5 CIGS A DAY  . Ovarian cyst   . Cervicitis   . Asthma     as child, no prob as adult, no inhaler  . Anemia     history with pregnancy, no current prob  . Anxiety     no meds  . History of ovarian cystectomy   . Abdominal hernia    Past Surgical History  Procedure Laterality Date  . Cervical biopsy  w/ loop electrode excision  02/2000  . Cesarean section  02/2001  . Wisdom tooth extraction    . Laparoscopy  04/24/2011    Procedure: LAPAROSCOPY OPERATIVE;  Surgeon: Ok Edwards, MD;  Location: [facility];  Service: Gynecology;  Laterality: N/A;  pelvic washings  . Ovarian cyst removal  04/24/2011    Procedure: OVARIAN CYSTECTOMY;   Surgeon: Ok Edwards, MD;  Location: [facility];  Service: Gynecology;  Laterality: Left;  Marland Kitchen Tympanostomy tube placement     Family History  Problem Relation Age of Onset  . Adopted: Yes  . Hypertension Father   . Cancer Maternal Grandmother     OVARIAN  . Breast cancer Maternal Grandmother   . Heart disease Maternal Grandmother   . Heart disease Maternal Grandfather   . Heart disease Paternal Grandmother   . Heart disease Paternal Grandfather    History  Substance Use Topics  . Smoking status: Current Every Day Smoker -- 0.50 packs/day for 12 years    Types: Cigarettes  . Smokeless tobacco: Never Used  . Alcohol Use: Yes     Comment: ocassionally   OB History    Gravida Para Term Preterm AB TAB SAB Ectopic Multiple Living   Review of Systems  Gastrointestinal: Positive for abdominal pain. Negative for nausea.  Genitourinary: Negative for vaginal bleeding and vaginal discharge.  Skin: Positive for rash.  All other systems reviewed and are negative.     Allergies  Benzoyl peroxide and Other  Home Medications   Prior to Admission medications   Medication Sig Start Date End Date Taking? Authorizing Provider  acetaminophen (TYLENOL) 500 MG  tablet Take 1,000 mg by mouth every 6 (six) hours as needed for mild pain or fever.    Historical Provider, MD  ferrous fumarate (HEMOCYTE - 106 MG FE) 325 (106 FE) MG TABS tablet Take 1 tablet by mouth daily.    Historical Provider, MD  HYDROcodone-acetaminophen (NORCO/VICODIN) 5-325 MG per tablet Take 2 tablets by mouth every 4 (four) hours as needed for moderate pain. 03/06/14   Gilda Crease, MD  ibuprofen (ADVIL,MOTRIN) 200 MG tablet Take 800 mg by mouth every 6 (six) hours as needed for fever or moderate pain.     Historical Provider, MD  QUEtiapine Fumarate (SEROQUEL XR) 150 MG 24 hr tablet Take 150 mg by mouth at bedtime.    Historical Provider, MD   BP 115/81 mmHg  Pulse 95  Temp(Src) 98.3 F  (36.8 C) (Oral)  Resp 16  SpO2 99% Physical Exam  Constitutional: She is oriented to person, place, and time. She appears well-developed and well-nourished.  HENT:  Head: Normocephalic.  Eyes: EOM are normal.  Neck: Normal range of motion.  Cardiovascular: Normal rate and normal heart sounds.   Pulmonary/Chest: Effort normal.  Abdominal: Soft. She exhibits no distension.  Pt was able to flex abdominal muscles and cause firm area along lateral incision on left,  Resolves with relaxing,  (I suspect small abdominal wall hernia)  Reduces with relaxation  Genitourinary: No vaginal discharge found.  Tender left adnexal area to palp,  Musculoskeletal: Normal range of motion.  Neurological: She is alert and oriented to person, place, and time.  Skin: Skin is warm.  Psychiatric: She has a normal mood and affect.  Nursing note and vitals reviewed.   ED Course  Procedures (including critical care time) Labs Review Labs Reviewed  URINALYSIS, ROUTINE W REFLEX MICROSCOPIC  PREGNANCY, URINE    Imaging Review US Transvaginal Non-ob  04/29/2014   CLINICAL DATA:  Patient with lower abdominal pain for 10 days. History of ovarian cyst.  EXAM: TRANSABDOMINAL AND TRANSVAGINAL ULTRASOUND OF PELVIS  DOPPLER ULTRASOUND OF OVARIES  TECHNIQUE: Both transabdominal and transvaginal ultrasound examinations of the pelvis were performed. Transabdominal technique was performed for global imaging of the pelvis including uterus, ovaries, adnexal regions, and pelvic cul-de-sac.  It was necessary to proceed with endovaginal exam following the transabdominal exam to visualize the adnexal structures. Color and duplex Doppler ultrasound was utilized to evaluate blood flow to the ovaries.  COMPARISON:  Pelvic ultrasound 03/06/2014  FINDINGS: Uterus  Measurements: 6.0 x 4.5 x 3.6 cm. No fibroids or other mass visualized. Retroverted.  Endometrium  Thickness: 6 mm.  No focal abnormality visualized.  Right ovary  Measurements:  3.0 x 1.2 x 2.5 cm. Normal appearance/no adnexal mass.  Left ovary  Measurements: 1.6 x 2.6 x 2.8 cm. Normal appearance/no adnexal mass.  Pulsed Doppler evaluation of both ovaries demonstrates normal low-resistance arterial and venous waveforms.  Other findings  No free fluid.  IMPRESSION: Unremarkable pelvic ultrasound.  No sonographic evidence to suggest ovarian torsion.   Electronically Signed   By: Annia Belt M.D.   On: 04/29/2014 15:22   US Pelvis Complete  04/29/2014   CLINICAL DATA:  Patient with lower abdominal pain for 10 days. History of ovarian cyst.  EXAM: TRANSABDOMINAL AND TRANSVAGINAL ULTRASOUND OF PELVIS  DOPPLER ULTRASOUND OF OVARIES  TECHNIQUE: Both transabdominal and transvaginal ultrasound examinations of the pelvis were performed. Transabdominal technique was performed for global imaging of the pelvis including uterus, ovaries, adnexal regions, and pelvic cul-de-sac.  It was  necessary to proceed with endovaginal exam following the transabdominal exam to visualize the adnexal structures. Color and duplex Doppler ultrasound was utilized to evaluate blood flow to the ovaries.  COMPARISON:  Pelvic ultrasound 03/06/2014  FINDINGS: Uterus  Measurements: 6.0 x 4.5 x 3.6 cm. No fibroids or other mass visualized. Retroverted.  Endometrium  Thickness: 6 mm.  No focal abnormality visualized.  Right ovary  Measurements: 3.0 x 1.2 x 2.5 cm. Normal appearance/no adnexal mass.  Left ovary  Measurements: 1.6 x 2.6 x 2.8 cm. Normal appearance/no adnexal mass.  Pulsed Doppler evaluation of both ovaries demonstrates normal low-resistance arterial and venous waveforms.  Other findings  No free fluid.  IMPRESSION: Unremarkable pelvic ultrasound.  No sonographic evidence to suggest ovarian torsion.   Electronically Signed   By: Annia Beltrew  Davis M.D.   On: 04/29/2014 15:22   [place] Art/ven Flow Abd Pelv Doppler  04/29/2014   CLINICAL DATA:  Patient with lower abdominal pain for 10 days. History of ovarian cyst.  EXAM:  TRANSABDOMINAL AND TRANSVAGINAL ULTRASOUND OF PELVIS  DOPPLER ULTRASOUND OF OVARIES  TECHNIQUE: Both transabdominal and transvaginal ultrasound examinations of the pelvis were performed. Transabdominal technique was performed for global imaging of the pelvis including uterus, ovaries, adnexal regions, and pelvic cul-de-sac.  It was necessary to proceed with endovaginal exam following the transabdominal exam to visualize the adnexal structures. Color and duplex Doppler ultrasound was utilized to evaluate blood flow to the ovaries.  COMPARISON:  Pelvic ultrasound 03/06/2014  FINDINGS: Uterus  Measurements: 6.0 x 4.5 x 3.6 cm. No fibroids or other mass visualized. Retroverted.  Endometrium  Thickness: 6 mm.  No focal abnormality visualized.  Right ovary  Measurements: 3.0 x 1.2 x 2.5 cm. Normal appearance/no adnexal mass.  Left ovary  Measurements: 1.6 x 2.6 x 2.8 cm. Normal appearance/no adnexal mass.  Pulsed Doppler evaluation of both ovaries demonstrates normal low-resistance arterial and venous waveforms.  Other findings  No free fluid.  IMPRESSION: Unremarkable pelvic ultrasound.  No sonographic evidence to suggest ovarian torsion.   Electronically Signed   By: Annia Beltrew  Davis M.D.   On: 04/29/2014 15:22     EKG Interpretation None      MDM   Final diagnoses:  Pain  Generalized abdominal pain    Ibuprofen Hydrocodone See the Gynecologist for evaluation    Elson AreasLeslie K Breane Grunwald, PA-C 04/29/14 1542  Enid SkeensJoshua M Zavitz, MD 05/01/14 [phone]

## 2014-04-29 NOTE — ED Notes (Signed)
Pt states that she has been experiencing lower abdominal pain and dysuria x 10 days. Pt also wants to be seen for a rash on her chest. There is a raised black area in the center of the rash and it is inflamed with redness around it.

## 2014-04-29 NOTE — ED Notes (Signed)
Pelvic cart at the bedside 

## 2014-05-01 NOTE — Progress Notes (Signed)
05/01/2014 4:16 PM Tammy Mathis, Tammy DockerJUDITH SPARKS,RN,BSN      Received phone call from patient regarding "rash on my chest". Patient reports that she was "just here the other day and seen for abdominal pain. The doctor who saw me told me she was going to give me a topical antibiotic cream for a rash on my chest but she didn't and now there is pus coming out of my chest." No mention in notes of intent to prescribe abx cream, K.Sophia not working currently. Advised patient that no medical advice could be given over the phone and encouraged patient to come back and be seen if she felt it necessary. No other needs identified.

## 2014-05-04 LAB — GC/CHLAMYDIA PROBE AMP (~~LOC~~) NOT AT ARMC
CHLAMYDIA, DNA PROBE: NEGATIVE
Neisseria Gonorrhea: NEGATIVE

## 2014-05-11 ENCOUNTER — Emergency Department (HOSPITAL_COMMUNITY): Payer: Self-pay

## 2014-05-11 ENCOUNTER — Emergency Department (HOSPITAL_COMMUNITY)
Admission: EM | Admit: 2014-05-11 | Discharge: 2014-05-11 | Disposition: A | Discharge status: Home / Self Care | Payer: Self-pay | Attending: Emergency Medicine | Admitting: Emergency Medicine

## 2014-05-11 ENCOUNTER — Encounter (HOSPITAL_COMMUNITY): Payer: Self-pay | Admitting: *Deleted

## 2014-05-11 DIAGNOSIS — Z720 Tobacco use: Secondary | ICD-10-CM | POA: Insufficient documentation

## 2014-05-11 DIAGNOSIS — L0291 Cutaneous abscess, unspecified: Secondary | ICD-10-CM

## 2014-05-11 DIAGNOSIS — J45909 Unspecified asthma, uncomplicated: Secondary | ICD-10-CM | POA: Insufficient documentation

## 2014-05-11 DIAGNOSIS — L0390 Cellulitis, unspecified: Secondary | ICD-10-CM

## 2014-05-11 DIAGNOSIS — Y998 Other external cause status: Secondary | ICD-10-CM | POA: Insufficient documentation

## 2014-05-11 DIAGNOSIS — W25XXXA Contact with sharp glass, initial encounter: Secondary | ICD-10-CM | POA: Insufficient documentation

## 2014-05-11 DIAGNOSIS — Z862 Personal history of diseases of the blood and blood-forming organs and certain disorders involving the immune mechanism: Secondary | ICD-10-CM | POA: Insufficient documentation

## 2014-05-11 DIAGNOSIS — R110 Nausea: Secondary | ICD-10-CM | POA: Insufficient documentation

## 2014-05-11 DIAGNOSIS — Z8719 Personal history of other diseases of the digestive system: Secondary | ICD-10-CM | POA: Insufficient documentation

## 2014-05-11 DIAGNOSIS — Z3202 Encounter for pregnancy test, result negative: Secondary | ICD-10-CM | POA: Insufficient documentation

## 2014-05-11 DIAGNOSIS — L03114 Cellulitis of left upper limb: Secondary | ICD-10-CM | POA: Insufficient documentation

## 2014-05-11 DIAGNOSIS — Y9289 Other specified places as the place of occurrence of the external cause: Secondary | ICD-10-CM | POA: Insufficient documentation

## 2014-05-11 DIAGNOSIS — Z8742 Personal history of other diseases of the female genital tract: Secondary | ICD-10-CM | POA: Insufficient documentation

## 2014-05-11 DIAGNOSIS — Z791 Long term (current) use of non-steroidal anti-inflammatories (NSAID): Secondary | ICD-10-CM | POA: Insufficient documentation

## 2014-05-11 DIAGNOSIS — Z8659 Personal history of other mental and behavioral disorders: Secondary | ICD-10-CM | POA: Insufficient documentation

## 2014-05-11 DIAGNOSIS — L02414 Cutaneous abscess of left upper limb: Secondary | ICD-10-CM | POA: Insufficient documentation

## 2014-05-11 DIAGNOSIS — Y9389 Activity, other specified: Secondary | ICD-10-CM | POA: Insufficient documentation

## 2014-05-11 LAB — POC URINE PREG, ED: Preg Test, Ur: NEGATIVE

## 2014-05-11 MED ORDER — LIDOCAINE HCL (PF) 1 % IJ SOLN
INTRAMUSCULAR | Status: AC
  Administered 2014-05-11: 20:00:00
  Filled 2014-05-11: qty 5

## 2014-05-11 MED ORDER — CLINDAMYCIN HCL 150 MG PO CAPS: 300.0000 mg | ORAL_CAPSULE | Freq: Three times a day (TID) | ORAL | Status: DC

## 2014-05-11 MED ORDER — NAPROXEN 250 MG PO TABS
500.0000 mg | ORAL_TABLET | Freq: Once | ORAL | Status: AC
  Administered 2014-05-11: 500 mg via ORAL
  Filled 2014-05-11: qty 2

## 2014-05-11 MED ORDER — DIAZEPAM 5 MG PO TABS
5.0000 mg | ORAL_TABLET | Freq: Once | ORAL | Status: AC
  Administered 2014-05-11: 5 mg via ORAL
  Filled 2014-05-11: qty 1

## 2014-05-11 MED ORDER — NAPROXEN 500 MG PO TABS: 500.0000 mg | ORAL_TABLET | Freq: Two times a day (BID) | ORAL | Status: DC

## 2014-05-11 NOTE — ED Provider Notes (Signed)
CSN: [id number]     Arrival date & time 05/11/14  1629 History   First MD Initiated Contact with Patient 05/11/14 1651     Chief Complaint  Patient presents with  . Arm Injury    Patient is a 34 y.o. female presenting with arm injury. The history is provided by the patient. No language interpreter was used.  Arm Injury Associated symptoms: no fever    This chart was scribed for nurse practitioner Donetta Potts, NP,  working with Tilden Fossa, MD, by Andrew Au, ED Scribe. This patient was seen in room TR04C/TR04C and the patient's care was started at 5:01 PM.  Tammy Mathis is a 34 y.o. female who presents to the Emergency Department complaining of a left arm injury that occurred 5-6 days ago. Pt states her left arm was hit with a heavy glass door about 6 days ago. She now has left arm pain with and what she believes is a hematoma.  She reports using ice and that helped with the pain but it does not seem to be getting better. She also reports increased redness and swelling that began worsening 1 day ago. Pt reports associated nausea but denies fever, chills and emesis.  Past Medical History  Diagnosis Date  . Smoker     5 CIGS A DAY  . Ovarian cyst   . Cervicitis   . Asthma     as child, no prob as adult, no inhaler  . Anemia     history with pregnancy, no current prob  . Anxiety     no meds  . History of ovarian cystectomy   . Abdominal hernia    Past Surgical History  Procedure Laterality Date  . Cervical biopsy  w/ loop electrode excision  02/2000  . Cesarean section  02/2001  . Wisdom tooth extraction    . Laparoscopy  04/24/2011    Procedure: LAPAROSCOPY OPERATIVE;  Surgeon: Ok Edwards, MD;  Location: [facility];  Service: Gynecology;  Laterality: N/A;  pelvic washings  . Ovarian cyst removal  04/24/2011    Procedure: OVARIAN CYSTECTOMY;  Surgeon: Ok Edwards, MD;  Location: [facility];  Service: Gynecology;  Laterality: Left;  Marland Kitchen Tympanostomy tube placement      Family History  Problem Relation Age of Onset  . Adopted: Yes  . Hypertension Father   . Cancer Maternal Grandmother     OVARIAN  . Breast cancer Maternal Grandmother   . Heart disease Maternal Grandmother   . Heart disease Maternal Grandfather   . Heart disease Paternal Grandmother   . Heart disease Paternal Grandfather    History  Substance Use Topics  . Smoking status: Current Every Day Smoker -- 0.50 packs/day for 12 years    Types: Cigarettes  . Smokeless tobacco: Never Used  . Alcohol Use: Yes     Comment: ocassionally   OB History    Gravida Para Term Preterm AB TAB SAB Ectopic Multiple Living   Review of Systems  Constitutional: Negative for fever and chills.  Gastrointestinal: Positive for nausea. Negative for vomiting.  Skin: Positive for color change.   Allergies  Benzoyl peroxide and Other  Home Medications   Prior to Admission medications   Medication Sig Start Date End Date Taking? Authorizing Provider  HYDROcodone-acetaminophen (NORCO/VICODIN) 5-325 MG per tablet Take 2 tablets by mouth every 4 (four) hours as needed. 04/29/14   Lonia Skinner  Sofia, PA-C  ibuprofen (ADVIL,MOTRIN) 800 MG tablet Take 1 tablet (800 mg total) by mouth 3 (three) times daily. 04/29/14   Elson AreasLeslie K Sofia, PA-C   BP 137/82 mmHg  Pulse 90  Temp(Src) 97.4 F (36.3 C)  Resp 20  SpO2 98% Physical Exam  Constitutional: She is oriented to person, place, and time. She appears well-developed and well-nourished. No distress.  HENT:  Head: Normocephalic and atraumatic.  Eyes: Conjunctivae and EOM are normal. Right eye exhibits no discharge. Left eye exhibits no discharge. No scleral icterus.  Neck: Neck supple.  Cardiovascular: Normal rate and intact distal pulses.   Pulmonary/Chest: Effort normal.  Musculoskeletal: Normal range of motion. She exhibits tenderness.  4 cm ovoid area of induration with central fluctuance over left antecubital fossa inferior to a 2 cm in  diameter area of induration with central fluctuance  Neurological: She is alert and oriented to person, place, and time.  Skin: Skin is warm and dry. She is not diaphoretic. There is erythema.  Psychiatric: She has a normal mood and affect. Her behavior is normal.  Nursing note and vitals reviewed.   ED Course  Procedures (including critical care time)  EMERGENCY DEPARTMENT US SOFT TISSUE INTERPRETATION "Study: Limited Ultrasound of the noted body part in comments below"  INDICATIONS: Pain and Soft tissue infection Multiple views of the body part are obtained with a multi-frequency linear probe  PERFORMED BY:  Myself  IMAGES ARCHIVED?: Yes  SIDE:Left  BODY PART:Upper extremity  FINDINGS: Abcess present and Cellulitis present  LIMITATIONS:  Pt discomfort  INTERPRETATION:  Abcess present and Cellulitis present  COMMENT:  None  INCISION AND DRAINAGE Performed by: Harle Battiestysinger, Ticara Waner Consent: Verbal consent obtained. Risks and benefits: risks, benefits and alternatives were discussed Type: abscess  Body area: left antecubital fossa  Anesthesia: local infiltration  Incision was made with a scalpel.  Local anesthetic: lidocaine 1% without epinephrine  Anesthetic total: 4 ml  Complexity: complex Blunt dissection to break up loculations  Drainage: purulent  Drainage amount: large  Packing material: N/A  Patient tolerance: Patient tolerated the procedure well with no immediate complications.   DIAGNOSTIC STUDIES: Oxygen Saturation is 98% on RA, normal by my interpretation.    COORDINATION OF CARE: 5:00 PM- Pt advised of plan for treatment and pt agrees.  Labs Review Labs Reviewed - No data to display  Imaging Review No results found.   EKG Interpretation None      MDM   Final diagnoses:  Cellulitis and abscess   34 yo with report of hitting inside of arm on a door last week and having a hematoma and worsening pain since then.  On exam, there  does not appear to be a hematoma but a large area of redness and swelling with the appearance of abscess and cellulitis limited to the antecubital fossa.  She denies any drug use other than marijuana but there appears to be a small pin prick in the center of each abscess.  Abscess drained with large amount of purulent discharge from both, not large enough for packing.  Discussed wound re-check in 2 days and warm, soapy water flushing and soaks.  Cellulitis in surrounding skin, so prescription for antibiotics provided. Pt is well-appearing, tearful after procedure but consoled after a few minutes. She is in no acute distress and vital signs reviewed and not concerning. She appears safe to be discharged.  Discussed return precautions and pt aware of plan.    I personally performed the services described in  this documentation, which was scribed in my presence. The recorded information has been reviewed and is accurate.  Filed Vitals:   05/11/14 1644  BP: 137/82  Pulse: 90  Temp: 97.4 F (36.3 C)  Resp: 20  SpO2: 98%   Meds given in ED:  Medications  naproxen (NAPROSYN) tablet 500 mg (500 mg Oral Given 05/11/14 1818)  diazepam (VALIUM) tablet 5 mg (5 mg Oral Given 05/11/14 1818)  lidocaine (PF) (XYLOCAINE) 1 % injection (  Given by Other 05/11/14 1931)    Discharge Medication List as of 05/11/2014  7:15 PM    START taking these medications   Details  clindamycin (CLEOCIN) 150 MG capsule Take 2 capsules (300 mg total) by mouth 3 (three) times daily. May dispense as  capsules, Starting 05/11/2014, Until Discontinued, Print    naproxen (NAPROSYN) 500 MG tablet Take 1 tablet (500 mg total) by mouth 2 (two) times daily., Starting 05/11/2014, Until Discontinued, Print           Harle Battiest, NP 05/12/14 2113  Tilden Fossa, MD 05/12/14 [phone]

## 2014-05-11 NOTE — Discharge Instructions (Signed)
Please follow the directions provided. Be sure to return for a wound recheck in 2 days to ensure the wound is getting better. In the meantime use warm soapy water soaks flushing the wound at least twice a day. Take your antibiotic as directed until it is all gone. Take the naproxen twice a day to help with pain. Don't hesitate to return for any new, worsening, or concerning symptoms.  SEEK IMMEDIATE MEDICAL CARE IF:  You develop increased pain, swelling, redness, drainage, or bleeding in the wound site.  You develop signs of generalized infection including muscle aches, chills, fever, or a general ill feeling.  An oral temperature above 102 F (38.9 C) develops, not controlled by medication.

## 2014-05-11 NOTE — ED Notes (Signed)
The pt is c/o of lt arm pain.  A heavy glass door struck her lt elbow and since then she has had pain and swelling in that elbow/. lmp none

## 2014-09-29 ENCOUNTER — Encounter (HOSPITAL_COMMUNITY): Payer: Self-pay | Admitting: Emergency Medicine

## 2014-09-29 ENCOUNTER — Observation Stay (HOSPITAL_COMMUNITY): Payer: Medicaid Other

## 2014-09-29 ENCOUNTER — Inpatient Hospital Stay (HOSPITAL_COMMUNITY)
Admission: EM | Admit: 2014-09-29 | Discharge: 2014-10-01 | DRG: 101 | Payer: Medicaid Other | Attending: Internal Medicine | Admitting: Internal Medicine

## 2014-09-29 ENCOUNTER — Emergency Department (HOSPITAL_COMMUNITY): Payer: Medicaid Other

## 2014-09-29 DIAGNOSIS — F17210 Nicotine dependence, cigarettes, uncomplicated: Secondary | ICD-10-CM | POA: Diagnosis present

## 2014-09-29 DIAGNOSIS — R945 Abnormal results of liver function studies: Secondary | ICD-10-CM

## 2014-09-29 DIAGNOSIS — R4182 Altered mental status, unspecified: Secondary | ICD-10-CM

## 2014-09-29 DIAGNOSIS — F1910 Other psychoactive substance abuse, uncomplicated: Secondary | ICD-10-CM

## 2014-09-29 DIAGNOSIS — N390 Urinary tract infection, site not specified: Secondary | ICD-10-CM | POA: Diagnosis present

## 2014-09-29 DIAGNOSIS — R768 Other specified abnormal immunological findings in serum: Secondary | ICD-10-CM | POA: Diagnosis present

## 2014-09-29 DIAGNOSIS — F1410 Cocaine abuse, uncomplicated: Secondary | ICD-10-CM | POA: Diagnosis present

## 2014-09-29 DIAGNOSIS — B1920 Unspecified viral hepatitis C without hepatic coma: Secondary | ICD-10-CM | POA: Diagnosis present

## 2014-09-29 DIAGNOSIS — Z8249 Family history of ischemic heart disease and other diseases of the circulatory system: Secondary | ICD-10-CM

## 2014-09-29 DIAGNOSIS — E876 Hypokalemia: Secondary | ICD-10-CM | POA: Diagnosis present

## 2014-09-29 DIAGNOSIS — F1123 Opioid dependence with withdrawal: Secondary | ICD-10-CM | POA: Diagnosis present

## 2014-09-29 DIAGNOSIS — R569 Unspecified convulsions: Secondary | ICD-10-CM | POA: Diagnosis not present

## 2014-09-29 DIAGNOSIS — R32 Unspecified urinary incontinence: Secondary | ICD-10-CM | POA: Diagnosis present

## 2014-09-29 DIAGNOSIS — R894 Abnormal immunological findings in specimens from other organs, systems and tissues: Secondary | ICD-10-CM | POA: Diagnosis not present

## 2014-09-29 DIAGNOSIS — M6282 Rhabdomyolysis: Secondary | ICD-10-CM | POA: Diagnosis present

## 2014-09-29 DIAGNOSIS — G40901 Epilepsy, unspecified, not intractable, with status epilepticus: Secondary | ICD-10-CM | POA: Diagnosis present

## 2014-09-29 DIAGNOSIS — D72829 Elevated white blood cell count, unspecified: Secondary | ICD-10-CM | POA: Diagnosis present

## 2014-09-29 DIAGNOSIS — R1013 Epigastric pain: Secondary | ICD-10-CM | POA: Diagnosis present

## 2014-09-29 DIAGNOSIS — R7989 Other specified abnormal findings of blood chemistry: Secondary | ICD-10-CM | POA: Diagnosis present

## 2014-09-29 DIAGNOSIS — F13239 Sedative, hypnotic or anxiolytic dependence with withdrawal, unspecified: Secondary | ICD-10-CM | POA: Diagnosis present

## 2014-09-29 DIAGNOSIS — R109 Unspecified abdominal pain: Secondary | ICD-10-CM

## 2014-09-29 HISTORY — DX: Other specified abnormal immunological findings in serum: R76.8

## 2014-09-29 HISTORY — DX: Unspecified convulsions: R56.9

## 2014-09-29 HISTORY — DX: Rhabdomyolysis: M62.82

## 2014-09-29 LAB — MAGNESIUM: Magnesium: 2.1 mg/dL (ref 1.7–2.4)

## 2014-09-29 LAB — COMPREHENSIVE METABOLIC PANEL
ALK PHOS: 114 U/L (ref 38–126)
ALT: 90 U/L — ABNORMAL HIGH (ref 14–54)
ANION GAP: 15 (ref 5–15)
AST: 92 U/L — AB (ref 15–41)
Albumin: 4.8 g/dL (ref 3.5–5.0)
BUN: 19 mg/dL (ref 6–20)
CALCIUM: 9.8 mg/dL (ref 8.9–10.3)
CO2: 22 mmol/L (ref 22–32)
CREATININE: 0.68 mg/dL (ref 0.44–1.00)
Chloride: 103 mmol/L (ref 101–111)
GFR calc Af Amer: >60 mL/min (ref >60)
GFR calc non Af Amer: >60 mL/min (ref >60)
Glucose, Bld: 106 mg/dL — ABNORMAL HIGH (ref 65–99)
Potassium: 3.3 mmol/L — ABNORMAL LOW (ref 3.5–5.1)
Sodium: 140 mmol/L (ref 135–145)
Total Bilirubin: 1 mg/dL (ref 0.3–1.2)
Total Protein: 9.4 g/dL — ABNORMAL HIGH (ref 6.5–8.1)

## 2014-09-29 LAB — CBC WITH DIFFERENTIAL/PLATELET
BASOS ABS: 0.1 10*3/uL (ref 0.0–0.1)
BASOS PCT: 0 % (ref 0–1)
EOS ABS: 0 10*3/uL (ref 0.0–0.7)
Eosinophils Relative: 0 % (ref 0–5)
HEMATOCRIT: 46.1 % — AB (ref 36.0–46.0)
Hemoglobin: 15.7 g/dL — ABNORMAL HIGH (ref 12.0–15.0)
LYMPHS ABS: 3 10*3/uL (ref 0.7–4.0)
LYMPHS PCT: 13 % (ref 12–46)
MCH: 26.3 pg (ref 26.0–34.0)
MCHC: 34.1 g/dL (ref 30.0–36.0)
MCV: 77.3 fL — ABNORMAL LOW (ref 78.0–100.0)
MONO ABS: 1.8 10*3/uL — AB (ref 0.1–1.0)
Monocytes Relative: 7 % (ref 3–12)
Neutro Abs: 19 10*3/uL — ABNORMAL HIGH (ref 1.7–7.7)
Neutrophils Relative %: 80 % — ABNORMAL HIGH (ref 43–77)
PLATELETS: 390 10*3/uL (ref 150–400)
RBC: 5.96 MIL/uL — AB (ref 3.87–5.11)
RDW: 16.2 % — ABNORMAL HIGH (ref 11.5–15.5)
WBC: 23.9 10*3/uL — AB (ref 4.0–10.5)

## 2014-09-29 LAB — URINALYSIS, ROUTINE W REFLEX MICROSCOPIC
Bilirubin Urine: NEGATIVE
Glucose, UA: NEGATIVE mg/dL
Leukocytes, UA: NEGATIVE
Nitrite: NEGATIVE
Protein, ur: 100 mg/dL — AB
Specific Gravity, Urine: 1.025 (ref 1.005–1.030)
Urobilinogen, UA: 0.2 mg/dL (ref 0.0–1.0)
pH: 6 (ref 5.0–8.0)

## 2014-09-29 LAB — RAPID URINE DRUG SCREEN, HOSP PERFORMED
AMPHETAMINES: NOT DETECTED
BENZODIAZEPINES: NOT DETECTED
Barbiturates: NOT DETECTED
Cocaine: POSITIVE — AB
OPIATES: NOT DETECTED
Tetrahydrocannabinol: NOT DETECTED

## 2014-09-29 LAB — PREGNANCY, URINE: Preg Test, Ur: NEGATIVE

## 2014-09-29 LAB — URINE MICROSCOPIC-ADD ON

## 2014-09-29 LAB — MRSA PCR SCREENING: MRSA by PCR: NEGATIVE

## 2014-09-29 MED ORDER — LEVETIRACETAM IN NACL 500 MG/100ML IV SOLN
500.0000 mg | Freq: Two times a day (BID) | INTRAVENOUS | Status: DC
  Administered 2014-09-30 (×2): 500 mg via INTRAVENOUS
  Filled 2014-09-29 (×7): qty 100

## 2014-09-29 MED ORDER — LORAZEPAM 2 MG/ML IJ SOLN
1.0000 mg | Freq: Once | INTRAMUSCULAR | Status: AC
  Administered 2014-09-29: 1 mg via INTRAVENOUS

## 2014-09-29 MED ORDER — MUPIROCIN 2 % EX OINT: 1.0000 "application " | TOPICAL_OINTMENT | Freq: Two times a day (BID) | CUTANEOUS | Status: DC

## 2014-09-29 MED ORDER — CETYLPYRIDINIUM CHLORIDE 0.05 % MT LIQD
7.0000 mL | Freq: Two times a day (BID) | OROMUCOSAL | Status: DC
  Administered 2014-09-29 – 2014-10-01 (×5): 7 mL via OROMUCOSAL

## 2014-09-29 MED ORDER — LORAZEPAM 2 MG/ML IJ SOLN: 1.0000 mg | INTRAMUSCULAR | Status: DC | PRN

## 2014-09-29 MED ORDER — ACETAMINOPHEN 325 MG PO TABS
650.0000 mg | ORAL_TABLET | Freq: Four times a day (QID) | ORAL | Status: DC | PRN
  Administered 2014-09-30 – 2014-10-01 (×3): 650 mg via ORAL
  Filled 2014-09-29 (×3): qty 2

## 2014-09-29 MED ORDER — ONDANSETRON HCL 4 MG/2ML IJ SOLN: 4.0000 mg | Freq: Four times a day (QID) | INTRAMUSCULAR | Status: DC | PRN

## 2014-09-29 MED ORDER — POTASSIUM CHLORIDE IN NACL 20-0.9 MEQ/L-% IV SOLN
INTRAVENOUS | Status: DC
  Administered 2014-09-29 – 2014-09-30 (×3): via INTRAVENOUS

## 2014-09-29 MED ORDER — ACETAMINOPHEN 650 MG RE SUPP: 650.0000 mg | Freq: Four times a day (QID) | RECTAL | Status: DC | PRN

## 2014-09-29 MED ORDER — CHLORHEXIDINE GLUCONATE CLOTH 2 % EX PADS: 6.0000 | MEDICATED_PAD | Freq: Every day | CUTANEOUS | Status: DC

## 2014-09-29 MED ORDER — ALBUTEROL SULFATE (2.5 MG/3ML) 0.083% IN NEBU: 2.5000 mg | INHALATION_SOLUTION | RESPIRATORY_TRACT | Status: DC | PRN

## 2014-09-29 MED ORDER — LEVETIRACETAM IN NACL 1000 MG/100ML IV SOLN
1000.0000 mg | Freq: Once | INTRAVENOUS | Status: AC
  Administered 2014-09-29: 1000 mg via INTRAVENOUS
  Filled 2014-09-29: qty 100

## 2014-09-29 MED ORDER — AMMONIA AROMATIC IN INHA
RESPIRATORY_TRACT | Status: AC
  Administered 2014-09-29: 13:00:00
  Filled 2014-09-29: qty 10

## 2014-09-29 MED ORDER — SODIUM CHLORIDE 0.9 % IV BOLUS (SEPSIS): 1000.0000 mL | Freq: Once | INTRAVENOUS | Status: DC

## 2014-09-29 MED ORDER — CHLORHEXIDINE GLUCONATE 0.12 % MT SOLN
15.0000 mL | Freq: Two times a day (BID) | OROMUCOSAL | Status: DC
  Administered 2014-09-29 – 2014-10-01 (×4): 15 mL via OROMUCOSAL
  Filled 2014-09-29 (×4): qty 15

## 2014-09-29 MED ORDER — SODIUM CHLORIDE 0.9 % IV BOLUS (SEPSIS)
1000.0000 mL | Freq: Once | INTRAVENOUS | Status: AC
  Administered 2014-09-29: 1000 mL via INTRAVENOUS

## 2014-09-29 MED ORDER — ONDANSETRON HCL 4 MG PO TABS: 4.0000 mg | ORAL_TABLET | Freq: Four times a day (QID) | ORAL | Status: DC | PRN

## 2014-09-29 MED ORDER — POTASSIUM CHLORIDE 10 MEQ/100ML IV SOLN
10.0000 meq | Freq: Once | INTRAVENOUS | Status: AC
  Administered 2014-09-29: 10 meq via INTRAVENOUS
  Filled 2014-09-29: qty 100

## 2014-09-29 MED ORDER — LORAZEPAM 2 MG/ML IJ SOLN
INTRAMUSCULAR | Status: AC
  Filled 2014-09-29: qty 1

## 2014-09-29 NOTE — ED Notes (Addendum)
Seizure lasted about 1 minute. Pt post-ictal. Pt responds to painful stimuli. Suctioned pt's mouth. Seizure pads placed on stretcher.

## 2014-09-29 NOTE — Consult Note (Signed)
[facility] Tammy Mathis A. Gerilyn Pilgrim, MD     [email or web address]          Tammy Mathis is an 34 y.o. female.   ASSESSMENT/PLAN: 1. Resolving status epilepticus. Etiology is suspected to be due to benzodiazepine withdrawal. 2. Opiate withdrawal but unlikely to be the etiology of seizures. 3. Altered mental status due to above. 4. Polysubstance drug abuse including opioids and heroine and benzodiazepines.   RECOMMENDATION: EEG. Continue with Keppra. Repeat neurological examination.  The patient is a 34 year old white female who is currently incarcerated for the last 3 days. There is a history of polysubstance drug abuse including heroin use. The patient was found to be unresponsive and incontinent of urine and feces after being incarcerated. She was confused. She was taken to the emergency room for further evaluation when she was noted to have a generalized tonoclonic seizure. She has been is an unresponsive since then. She was loaded with Keppra and also given Ativan. She continues to have an altered sensorium and cannot provide a history.   GENERAL: Drowsy but in no acute distress.  HEENT: Supple. Atraumatic normocephalic.   ABDOMEN: soft  EXTREMITIES: No edema. Multiple track marks involving the dorsum of the hands bilaterally.   BACK: Normal.  SKIN: Normal by inspection.    MENTAL STATUS: She does awaken spontaneously. She knows that she is in the hospital but otherwise is disoriented. She speaks little and requires multiple prompting to follow commands.     CRANIAL NERVES: Pupils are equal, round and reactive to light; extra ocular movements are full, there is no significant nystagmus; visual fields are full; upper and lower facial muscles are normal in strength and symmetric, there is no flattening of the nasolabial folds; tongue is midline; uvula is midline; shoulder elevation is normal.  MOTOR: Normal tone, bulk and strength; no pronator drift.  COORDINATION:  There is no dysmetria. There is no tremors.  REFLEXES: Deep tendon reflexes are symmetrical and normal. Babinski reflexes are flexor on the right but extensor on the left.   SENSATION: Normal to pain stimuli.     HOSPITALIST NOTES: Chief Complaint: Found with altered mental status and incontinent of urine and stool in the local jail; witnessed tonic-clonic seizure in the ED.  HPI: Tammy Mathis is a 34 y.o. female with a history of polysubstance abuse including IV heroin and cocaine, asthma, anemia, and chronic anxiety. She is currently obtunded/postictal and is unable to provide any history. The history is being provided by the E chart, ED M.D. and staff, and by [employer]. Accordingly, the patient was incarcerated 2-3 days ago for violating her probation. Apparently, she complained of a global headache and disclosed that she used IV heroin prior to her incarceration.Marland Kitchen She was found moaning in her jail cell this morning. She was also found to be incontinent of her bladder and bowels. There was also some vomiting. She was too weak to get up. She was brought to the ED. During her stay in the ED, she had a witnessed tonic-clonic seizure. She was given 1 mg of lorazepam and 1 g of Keppra. Since that time, according to the nursing staff, she has been unresponsive, but has been protecting her airway.     Blood pressure 116/66, pulse 101, temperature 98.3 F (36.8 C), temperature source Axillary, resp. rate 27, height 5\' 6"  (1.676 m), weight 51.5 kg (113 lb 8.6 oz), SpO2 99 %.  Past Medical History  Diagnosis Date  . Smoker  5 CIGS A DAY  . Ovarian cyst   . Cervicitis   . Asthma     as child, no prob as adult, no inhaler  . Anemia     history with pregnancy, no current prob  . Anxiety     no meds  . History of ovarian cystectomy   . Abdominal hernia     Past Surgical History  Procedure Laterality Date  . Cervical biopsy  w/ loop electrode excision  02/2000  . Cesarean  section  02/2001  . Wisdom tooth extraction    . Laparoscopy  04/24/2011    Procedure: LAPAROSCOPY OPERATIVE;  Surgeon: Ok Edwards, MD;  Location: [facility];  Service: Gynecology;  Laterality: N/A;  pelvic washings  . Ovarian cyst removal  04/24/2011    Procedure: OVARIAN CYSTECTOMY;  Surgeon: Ok Edwards, MD;  Location: [facility];  Service: Gynecology;  Laterality: Left;  Marland Kitchen Tympanostomy tube placement      Family History  Problem Relation Age of Onset  . Adopted: Yes  . Hypertension Father   . Cancer Maternal Grandmother     OVARIAN  . Breast cancer Maternal Grandmother   . Heart disease Maternal Grandmother   . Heart disease Maternal Grandfather   . Heart disease Paternal Grandmother   . Heart disease Paternal Grandfather     Social History:  reports that she has been smoking Cigarettes.  She has a 6 pack-year smoking history. She has never used smokeless tobacco. She reports that she drinks alcohol. She reports that she uses illicit drugs.  Allergies:  Allergies  Allergen Reactions  . Benzoyl Peroxide Swelling    Topical  . Other Rash    Feathers, Cedar, blue grass, Benzoil topical.    Medications: Prior to Admission medications   Medication Sig Start Date End Date Taking? Authorizing Provider  clindamycin (CLEOCIN) 150 MG capsule Take 2 capsules (300 mg total) by mouth 3 (three) times daily. May dispense as 150mg  capsules Patient not taking: Reported on 09/29/2014 05/11/14   07/10/14, NP  HYDROcodone-acetaminophen (NORCO/VICODIN) 5-325 MG per tablet Take 2 tablets by mouth every 4 (four) hours as needed. Patient not taking: Reported on 09/29/2014 04/29/14   05/01/14, PA-C  ibuprofen (ADVIL,MOTRIN) 800 MG tablet Take 1 tablet (800 mg total) by mouth 3 (three) times daily. Patient not taking: Reported on 09/29/2014 04/29/14   05/01/14, PA-C  naproxen (NAPROSYN) 500 MG tablet Take 1 tablet (500 mg total) by mouth 2 (two) times daily. Patient not taking:  Reported on 09/29/2014 05/11/14   07/10/14, NP    Scheduled Meds: . antiseptic oral rinse  7 mL Mouth Rinse q12n4p  . chlorhexidine  15 mL Mouth Rinse BID  . [START ON 09/30/2014] levETIRAcetam  500 mg Intravenous Q12H   Continuous Infusions: . 0.9 % NaCl with KCl 20 mEq / L 125 mL/hr at 09/29/14 1800   PRN Meds:.acetaminophen **OR** acetaminophen, albuterol, LORazepam, ondansetron **OR** ondansetron (ZOFRAN) IV     Results for orders placed or performed during the hospital encounter of 09/29/14 (from the past 48 hour(s))  Comprehensive metabolic panel     Status: Abnormal   Collection Time: 09/29/14  9:49 AM  Result Value Ref Range   Sodium 140 135 - 145 mmol/L   Potassium 3.3 (L) 3.5 - 5.1 mmol/L   Chloride 103 101 - 111 mmol/L   CO2 22 22 - 32 mmol/L   Glucose, Bld 106 (H) 65 - 99 mg/dL  BUN 19 6 - 20 mg/dL   Creatinine, Ser 0.68 0.44 - 1.00 mg/dL   Calcium 9.8 8.9 - 10.3 mg/dL   Total Protein 9.4 (H) 6.5 - 8.1 g/dL   Albumin 4.8 3.5 - 5.0 g/dL   AST 92 (H) 15 - 41 U/L   ALT 90 (H) 14 - 54 U/L   Alkaline Phosphatase 114 38 - 126 U/L   Total Bilirubin 1.0 0.3 - 1.2 mg/dL   GFR calc non Af Amer >60 >60 mL/min   GFR calc Af Amer >60 >60 mL/min    Comment: (NOTE) The eGFR has been calculated using the CKD EPI equation. This calculation has not been validated in all clinical situations. eGFR's persistently <60 mL/min signify possible Chronic Kidney Disease.    Anion gap 15 5 - 15  CBC with Differential     Status: Abnormal   Collection Time: 09/29/14  9:49 AM  Result Value Ref Range   WBC 23.9 (H) 4.0 - 10.5 K/uL   RBC 5.96 (H) 3.87 - 5.11 MIL/uL   Hemoglobin 15.7 (H) 12.0 - 15.0 g/dL   HCT 46.1 (H) 36.0 - 46.0 %   MCV 77.3 (L) 78.0 - 100.0 fL   MCH 26.3 26.0 - 34.0 pg   MCHC 34.1 30.0 - 36.0 g/dL   RDW 16.2 (H) 11.5 - 15.5 %   Platelets 390 150 - 400 K/uL   Neutrophils Relative % 80 (H) 43 - 77 %   Neutro Abs 19.0 (H) 1.7 - 7.7 K/uL   Lymphocytes  Relative 13 12 - 46 %   Lymphs Abs 3.0 0.7 - 4.0 K/uL   Monocytes Relative 7 3 - 12 %   Monocytes Absolute 1.8 (H) 0.1 - 1.0 K/uL   Eosinophils Relative 0 0 - 5 %   Eosinophils Absolute 0.0 0.0 - 0.7 K/uL   Basophils Relative 0 0 - 1 %   Basophils Absolute 0.1 0.0 - 0.1 K/uL   WBC Morphology ATYPICAL LYMPHOCYTES   Urinalysis, Routine w reflex microscopic (not at [facility])     Status: Abnormal   Collection Time: 09/29/14 10:30 AM  Result Value Ref Range   Color, Urine YELLOW YELLOW   APPearance CLEAR CLEAR   Specific Gravity, Urine 1.025 1.005 - 1.030   pH 6.0 5.0 - 8.0   Glucose, UA NEGATIVE NEGATIVE mg/dL   Hgb urine dipstick LARGE (A) NEGATIVE   Bilirubin Urine NEGATIVE NEGATIVE   Ketones, ur TRACE (A) NEGATIVE mg/dL   Protein, ur 100 (A) NEGATIVE mg/dL   Urobilinogen, UA 0.2 0.0 - 1.0 mg/dL   Nitrite NEGATIVE NEGATIVE   Leukocytes, UA NEGATIVE NEGATIVE  Pregnancy, urine     Status: None   Collection Time: 09/29/14 10:30 AM  Result Value Ref Range   Preg Test, Ur NEGATIVE NEGATIVE  Urine rapid drug screen (hosp performed)     Status: Abnormal   Collection Time: 09/29/14 10:30 AM  Result Value Ref Range   Opiates NONE DETECTED NONE DETECTED   Cocaine POSITIVE (A) NONE DETECTED   Benzodiazepines NONE DETECTED NONE DETECTED   Amphetamines NONE DETECTED NONE DETECTED   Tetrahydrocannabinol NONE DETECTED NONE DETECTED   Barbiturates NONE DETECTED NONE DETECTED    Comment:        DRUG SCREEN FOR MEDICAL PURPOSES ONLY.  IF CONFIRMATION IS NEEDED FOR ANY PURPOSE, NOTIFY LAB WITHIN 5 DAYS.        LOWEST DETECTABLE LIMITS FOR URINE DRUG SCREEN Drug Class  Cutoff (ng/mL) Amphetamine      1000 Barbiturate      200 Benzodiazepine   030 Tricyclics       092 Opiates          300 Cocaine          300 THC              50   Urine microscopic-add on     Status: Abnormal   Collection Time: 09/29/14 10:30 AM  Result Value Ref Range   RBC / HPF TOO NUMEROUS TO COUNT <3 RBC/hpf     Bacteria, UA MANY (A) RARE  MRSA PCR Screening     Status: None   Collection Time: 09/29/14  2:00 PM  Result Value Ref Range   MRSA by PCR NEGATIVE NEGATIVE    Comment:        The GeneXpert MRSA Assay (FDA approved for NASAL specimens only), is one component of a comprehensive MRSA colonization surveillance program. It is not intended to diagnose MRSA infection nor to guide or monitor treatment for MRSA infections.   Magnesium     Status: None   Collection Time: 09/29/14  3:46 PM  Result Value Ref Range   Magnesium 2.1 1.7 - 2.4 mg/dL    Studies/Results:  Head CT scan normal.   Symir Mah A. Merlene Laughter, M.D.  Diplomate, Tax adviser of Psychiatry and Neurology ( Neurology). 09/29/2014, 7:13 PM

## 2014-09-29 NOTE — H&P (Signed)
[facility] History and Physical  Tammy Mathis:[id number] DOB: 1980/05/30 DOA: 09/29/2014  Referring physician: ED nurse practitioner, Ms. Rubin Payor. PCP: No PCP Per Patient   Chief Complaint: Found with altered mental status and incontinent of urine and stool in the local jail; witnessed tonic-clonic seizure in the ED.  HPI: Tammy Mathis is a 34 y.o. female with a history of polysubstance abuse including IV heroin and cocaine, asthma, anemia, and chronic anxiety. She is currently obtunded/postictal and is unable to provide any history. The history is being provided by the E chart, ED M.D. and staff, and by [employer]. Accordingly, the patient was incarcerated 2-3 days ago for violating her probation. Apparently, she complained of a global headache and disclosed that she used IV heroin prior to her incarceration.Marland Kitchen She was found moaning in her jail cell this morning. She was also found to be incontinent of her bladder and bowels. There was also some vomiting. She was too weak to get up. She was brought to the ED. During her stay in the ED, she had a witnessed tonic-clonic seizure. She was given 1 mg of lorazepam and 1 g of Keppra. Since that time, according to the nursing staff, she has been unresponsive, but has been protecting her airway.  In the ED, she is mildly tachycardic with a heart rate ranging from 108-123. Her blood pressure has ranged from 119/85-161/90. She is oxygenating between 98 and 100% on room air. CT of her head revealed no acute intracranial findings. Her chest x-ray revealed no acute findings. Her lab data were significant for a WBC of 23.9, hemoglobin of 15.9, and potassium of 3.3. Her urinalysis revealed too numerous to count WBCs (in and out catheterization). She is being admitted for further evaluation and management.    Review of Systems:  Unable to obtain secondary to the patient's mental status.  Past Medical History  Diagnosis Date  . Smoker       5 CIGS A DAY  . Ovarian cyst   . Cervicitis   . Asthma     as child, no prob as adult, no inhaler  . Anemia     history with pregnancy, no current prob  . Anxiety     no meds  . History of ovarian cystectomy   . Abdominal hernia    Past Surgical History  Procedure Laterality Date  . Cervical biopsy  w/ loop electrode excision  02/2000  . Cesarean section  02/2001  . Wisdom tooth extraction    . Laparoscopy  04/24/2011    Procedure: LAPAROSCOPY OPERATIVE;  Surgeon: Ok Edwards, MD;  Location: [facility];  Service: Gynecology;  Laterality: N/A;  pelvic washings  . Ovarian cyst removal  04/24/2011    Procedure: OVARIAN CYSTECTOMY;  Surgeon: Ok Edwards, MD;  Location: [facility];  Service: Gynecology;  Laterality: Left;  Marland Kitchen Tympanostomy tube placement     Social History: Per history recorded, she smokes cigarettes. She uses IV drugs including heroin and cocaine.   Allergies  Allergen Reactions  . Benzoyl Peroxide Swelling    Topical  . Other Rash    Feathers, Cedar, blue grass, Benzoil topical.    Family History  Problem Relation Age of Onset  . Adopted: Yes  . Hypertension Father   . Cancer Maternal Grandmother     OVARIAN  . Breast cancer Maternal Grandmother   . Heart disease Maternal Grandmother   . Heart disease Maternal Grandfather   . Heart disease Paternal Grandmother   .  Heart disease Paternal Grandfather      Prior to Admission medications   Medication Sig Start Date End Date Taking? Authorizing Provider  clindamycin (CLEOCIN) 150 MG capsule Take 2 capsules (300 mg total) by mouth 3 (three) times daily. May dispense as  capsules Patient not taking: Reported on 09/29/2014 05/11/14   Harle Battiest, NP  HYDROcodone-acetaminophen (NORCO/VICODIN) 5-325 MG per tablet Take 2 tablets by mouth every 4 (four) hours as needed. Patient not taking: Reported on 09/29/2014 04/29/14   Elson Areas, PA-C  ibuprofen (ADVIL,MOTRIN) 800 MG tablet Take 1 tablet (800 mg  total) by mouth 3 (three) times daily. Patient not taking: Reported on 09/29/2014 04/29/14   Elson Areas, PA-C  naproxen (NAPROSYN) 500 MG tablet Take 1 tablet (500 mg total) by mouth 2 (two) times daily. Patient not taking: Reported on 09/29/2014 05/11/14   Harle Battiest, NP   Physical Exam: Filed Vitals:   09/29/14 1210 09/29/14 1215 09/29/14 1314 09/29/14 1334  BP: 161/90  148/87 137/109  Pulse: 121 123 119 108  Temp:      TempSrc:      Resp: Height:     (1.676 m)  Weight:    51.5 kg (113 lb 8.6 oz)  SpO2:  100% 100% 98%    Wt Readings from Last 3 Encounters:  09/29/14 51.5 kg (113 lb 8.6 oz)  01/11/14 54.432 kg (120 lb)  11/03/13 52.164 kg (115 lb)    General:  [name] who is obtunded/sedated/post ictal. No acute distress. Eyes: PERRL, normal lids, irises & conjunctiva; conjunctivae are clear and sclerae are white. ENT: Lips are grossly normal. She resist mouth opening for examination. Neck: no LAD, masses or thyromegaly Cardiovascular: S1, S2, with tachycardia. No LE edema. Telemetry: Sinus tachycardia.  Respiratory: CTA bilaterally, no w/r/r. Normal respiratory effort. Abdomen: Positive bowel sounds, soft, nontender, nondistended. Skin: There is a color tattoo on her left arm; there are a few IV tracks on her right arm and some mild peak/red ecchymotic lesions on her legs. Musculoskeletal: grossly normal tone BUE/BLE; no acute hot red joints. Psychiatric: Obtunded/post ictal. Neurologic: She is obtunded/post ictal, but resist mouth opening and withdraws a little with all of her extremities to provocation. Pupils are equal and reactive to light. Very little reaction to ammonia held on her nose for few seconds.           Labs on Admission:  Basic Metabolic Panel:  Recent Labs Lab 09/29/14 0949  NA 140  K 3.3*  CL 103  CO2 22  GLUCOSE 106*  BUN 19  CREATININE 0.68  CALCIUM 9.8   Liver Function Tests:  Recent Labs Lab  09/29/14 0949  AST 92*  ALT 90*  ALKPHOS 114  BILITOT 1.0  PROT 9.4*  ALBUMIN 4.8   No results for input(s): LIPASE, AMYLASE in the last 168 hours. No results for input(s): AMMONIA in the last 168 hours. CBC:  Recent Labs Lab 09/29/14 0949  WBC 23.9*  NEUTROABS 19.0*  HGB 15.7*  HCT 46.1*  MCV 77.3*  PLT 390   Cardiac Enzymes: No results for input(s): CKTOTAL, CKMB, CKMBINDEX, TROPONINI in the last 168 hours.  BNP (last 3 results) No results for input(s): BNP in the last 8760 hours.  ProBNP (last 3 results) No results for input(s): PROBNP in the last 8760 hours.  CBG: No results for input(s): GLUCAP in the last 168 hours.  Radiological Exams on Admission: Ct Head Wo Contrast  09/29/2014   CLINICAL DATA:  Altered mental status  EXAM: CT HEAD WITHOUT CONTRAST  TECHNIQUE: Contiguous axial images were obtained from the base of the skull through the vertex without intravenous contrast.  COMPARISON:  Aug 13, 2010  FINDINGS: The ventricles are normal in size and configuration. There is no intracranial mass, hemorrhage, extra-axial fluid collection, or midline shift. Gray-white compartments are normal. No acute infarct evident. Bony calvarium appears intact. The mastoid air cells are clear.  IMPRESSION: Study within normal limits.   Electronically Signed   By: Bretta Bang III M.D.   On: 09/29/2014 10:07   Dg Chest Port 1 View  09/29/2014   CLINICAL DATA:  Found unresponsive, vomiting, seizure  EXAM: PORTABLE CHEST - 1 VIEW  COMPARISON:  Chest x-ray of 01/11/2014  FINDINGS: No active infiltrate or effusion is seen. Mediastinal and hilar contours are unremarkable. The heart is within normal limits in size. No bony abnormality is seen.  IMPRESSION: No active disease.   Electronically Signed   By: Dwyane Dee M.D.   On: 09/29/2014 13:05    EKG: Independently reviewed. Normal sinus rhythm with nonspecific ST and T-wave abnormalities; heart rate 94 bpm.  Assessment/Plan Principal  Problem:   Seizure Active Problems:   Post-ictal state   Leukocytosis   Polysubstance abuse   Hypokalemia   1. Seizure with likely post ictal encephalopathy. The patient was found to be incontinent of her bladder and bowels and with an episode of vomiting at the local jail. She was found to be encephalopathic at the local jail and in the ED. She was witnessed to have a tonic-clonic seizure. It is unclear whether or not the patient has a history of seizures, but none has been documented in the  E-chart from previous ED visits/hospitalizations. CT of her head was unremarkable. Etiology of her seizure is unknown, but could be secondary to withdrawal syndrome from heroin and/or cocaine. She was given 1 g of IV Keppra and 1 mg of lorazepam. She is still encephalopathic which could be secondary to her post ictal state and anti-epileptic drugs. She is protecting her airway. We'll admit her to the stepdown unit for closer monitoring. We'll continue Keppra 500 mg every 12 hours and when necessary lorazepam. Seizure precautions and neuro checks every 2 hours 12 hours. [facility] consult neurology. 2. Incontinent of bladder and bowels and vomiting. Her abdomen is benign on exam. Will start IV Pepcid empirically. Zofran has been ordered when necessary for nausea and vomiting. 3. Hypokalemia. She was given one run of IV potassium in the ED. Will add potassium to her maintenance IV fluids. Will check a magnesium level to rule out deficiency. 4. Leukocytosis. Her urinalysis reveals RBCs, but not WBCs-likely secondary to catheterization. Her chest x-ray reveals no acute findings. The leukocytosis may be secondary to de-margination from the seizure. We'll continue to monitor. Will order blood cultures only if she becomes febrile; if she does, but start empiric antibiotic. 5. Reported polysubstance abuse. The patient reportedly admitted to IV heroin prior to her incarceration. Her urine drug screen was positive for cocaine,  but negative for opiates. Will order a social work consult for assistance with outpatient management. 6. The patient is incarcerated and is under the supervision of the [facility] department. 7. We'll order TSH, acute hepatitis panel, and HIV antibody for further evaluation.    Code Status: Full code DVT Prophylaxis: SCDs Family Communication: Family not available Disposition Plan: We'll discharge when she is clinically improved and when  clinically appropriate.  Time spent: One hour.  [facility] Pager [phone]

## 2014-09-29 NOTE — ED Notes (Signed)
Sheriff called out to RN stating pt was having a seizure. NP Teressa Lower and MD Juleen China at bedside.

## 2014-09-29 NOTE — ED Provider Notes (Signed)
CSN: [id number]     Arrival date & time 09/29/14  3335 History   First MD Initiated Contact with Patient 09/29/14 0920     Chief Complaint  Patient presents with  . Altered Mental Status     (Consider location/radiation/quality/duration/timing/severity/associated sxs/prior Treatment) HPI Comments: Pt was brought in in custody. Pt vomited and defecated on herself earlier. She was found moaning ant was to weak to get up. At some pt she told someone she had a headache   Past Medical History  Diagnosis Date  . Smoker     5 CIGS A DAY  . Ovarian cyst   . Cervicitis   . Asthma     as child, no prob as adult, no inhaler  . Anemia     history with pregnancy, no current prob  . Anxiety     no meds  . History of ovarian cystectomy   . Abdominal hernia    Past Surgical History  Procedure Laterality Date  . Cervical biopsy  w/ loop electrode excision  02/2000  . Cesarean section  02/2001  . Wisdom tooth extraction    . Laparoscopy  04/24/2011    Procedure: LAPAROSCOPY OPERATIVE;  Surgeon: Ok Edwards, MD;  Location: [facility];  Service: Gynecology;  Laterality: N/A;  pelvic washings  . Ovarian cyst removal  04/24/2011    Procedure: OVARIAN CYSTECTOMY;  Surgeon: Ok Edwards, MD;  Location: [facility];  Service: Gynecology;  Laterality: Left;  Marland Kitchen Tympanostomy tube placement     Family History  Problem Relation Age of Onset  . Adopted: Yes  . Hypertension Father   . Cancer Maternal Grandmother     OVARIAN  . Breast cancer Maternal Grandmother   . Heart disease Maternal Grandmother   . Heart disease Maternal Grandfather   . Heart disease Paternal Grandmother   . Heart disease Paternal Grandfather    History  Substance Use Topics  . Smoking status: Current Every Day Smoker -- 0.50 packs/day for 12 years    Types: Cigarettes  . Smokeless tobacco: Never Used  . Alcohol Use: Yes     Comment: ocassionally   OB History    Gravida Para Term Preterm AB TAB SAB Ectopic Multiple  Living   1 1 1       1      Review of Systems  Unable to perform ROS: Mental status change      Allergies  Benzoyl peroxide and Other  Home Medications   Prior to Admission medications   Medication Sig Start Date End Date Taking? Authorizing Provider  clindamycin (CLEOCIN) 150 MG capsule Take 2 capsules (300 mg total) by mouth 3 (three) times daily. May dispense as 150mg  capsules 05/11/14   Harle Battiest, NP  HYDROcodone-acetaminophen (NORCO/VICODIN) 5-325 MG per tablet Take 2 tablets by mouth every 4 (four) hours as needed. 04/29/14   Elson Areas, PA-C  ibuprofen (ADVIL,MOTRIN) 800 MG tablet Take 1 tablet (800 mg total) by mouth 3 (three) times daily. 04/29/14   Elson Areas, PA-C  naproxen (NAPROSYN) 500 MG tablet Take 1 tablet (500 mg total) by mouth 2 (two) times daily. 05/11/14   Harle Battiest, NP   BP 138/115 mmHg  Pulse 96  Temp(Src) 98.2 F (36.8 C) (Oral)  Resp 20  Ht 5\' 5"  (1.651 m)  Wt 115 lb (52.164 kg)  BMI 19.14 kg/m2  SpO2 99% Physical Exam  Constitutional: She appears well-developed.  HENT:  Head: Normocephalic.  Right Ear: External ear normal.  Left Ear: External ear normal.  Eyes: Conjunctivae and EOM are normal. Pupils are equal, round, and reactive to light.  Neck: Normal range of motion. Neck supple.  Cardiovascular: Normal rate and regular rhythm.   Pulmonary/Chest: Effort normal and breath sounds normal.  Abdominal: Soft. Bowel sounds are normal. There is no tenderness.  Musculoskeletal: Normal range of motion.  Neurological: She is alert. She exhibits normal muscle tone. Coordination normal. GCS eye subscore is 4. GCS verbal subscore is 1.  Pt not follow commands but does open her eyes with auditory and tactile stimulation  Skin:  Multiple scabbed areas to bilateral upper extremities  Nursing note and vitals reviewed.   ED Course  Procedures (including critical care time) Labs Review Labs Reviewed  URINALYSIS, ROUTINE W REFLEX  MICROSCOPIC (NOT AT [facility]) - Abnormal; Notable for the following:    Hgb urine dipstick LARGE (*)    Ketones, ur TRACE (*)    Protein, ur 100 (*)    All other components within normal limits  COMPREHENSIVE METABOLIC PANEL - Abnormal; Notable for the following:    Potassium 3.3 (*)    Glucose, Bld 106 (*)    Total Protein 9.4 (*)    AST 92 (*)    ALT 90 (*)    All other components within normal limits  CBC WITH DIFFERENTIAL/PLATELET - Abnormal; Notable for the following:    WBC 23.9 (*)    RBC 5.96 (*)    Hemoglobin 15.7 (*)    HCT 46.1 (*)    MCV 77.3 (*)    RDW 16.2 (*)    Neutrophils Relative % 80 (*)    Neutro Abs 19.0 (*)    Monocytes Absolute 1.8 (*)    All other components within normal limits  URINE RAPID DRUG SCREEN, HOSP PERFORMED - Abnormal; Notable for the following:    Cocaine POSITIVE (*)    All other components within normal limits  URINE MICROSCOPIC-ADD ON - Abnormal; Notable for the following:    Bacteria, UA MANY (*)    All other components within normal limits  PREGNANCY, URINE    Imaging Review Ct Head Wo Contrast  09/29/2014   CLINICAL DATA:  Altered mental status  EXAM: CT HEAD WITHOUT CONTRAST  TECHNIQUE: Contiguous axial images were obtained from the base of the skull through the vertex without intravenous contrast.  COMPARISON:  Aug 13, 2010  FINDINGS: The ventricles are normal in size and configuration. There is no intracranial mass, hemorrhage, extra-axial fluid collection, or midline shift. Gray-white compartments are normal. No acute infarct evident. Bony calvarium appears intact. The mastoid air cells are clear.  IMPRESSION: Study within normal limits.   Electronically Signed   By: Bretta Bang III M.D.   On: 09/29/2014 10:07     EKG Interpretation   Date/Time:  Thursday September 29 2014 09:34:22 EDT Ventricular Rate:  94 PR Interval:  135 QRS Duration: 86 QT Interval:  355 QTC Calculation: 444 R Axis:   -98 Text Interpretation:  Sinus rhythm  Biatrial enlargement Non-specific ST-t  changes Confirmed by Juleen China  MD, STEPHEN ([id number]) on 09/29/2014 11:04:20 AM      MDM   Final diagnoses:  Altered mental state  New onset seizure  Leukocytosis  Hypokalemia   12:20 PM Pt had new onset tonic clonic seizure in the er. Pt likely post ictal earlier. Will admit to hospital. Pt started on keppra    Teressa Lower, NP 09/29/14 1242  Raeford Razor, MD 10/02/14 1556

## 2014-09-29 NOTE — ED Notes (Signed)
Pt altered mental status today. Lethargic and not talking today. Some moaning per officer from [employer]. Pt had vomited and defecated on herself this am per officer. Pt in custody since 1500 on 09/26/14.

## 2014-09-29 NOTE — H&P (Deleted)
[facility] History and Physical  [name] [id number]:[id number] DOB: 1980/09/26 DOA: 09/29/2014  Referring physician: ED nurse practitioner, Ms. Rubin Payor. PCP: No PCP Per Patient   Chief Complaint: Found with altered mental status and incontinent of urine and stool in the local jail; witnessed tonic-clonic seizure in the ED.  HPI: Tammy Mathis is a 34 y.o. female with a history of polysubstance abuse including IV heroin and cocaine, asthma, anemia, and chronic anxiety. She is currently obtunded/postictal and is unable to provide any history. The history is being provided by the E chart, ED M.D. and staff, and by [employer]. Accordingly, the patient was incarcerated 2-3 days ago for violating her probation. Apparently, she complained of a global headache and disclosed that she used IV heroin prior to her incarceration.Marland Kitchen She was found moaning in her jail cell this morning. She was also found to be incontinent of her bladder and bowels. There was also some vomiting. She was too weak to get up. She was brought to the ED. During her stay in the ED, she had a witnessed tonic-clonic seizure. She was given 1 mg of lorazepam and 1 g of Keppra. Since that time, according to the nursing staff, she has been unresponsive, but has been protecting her airway.  In the ED, she is mildly tachycardic with a heart rate ranging from 108-123. Her blood pressure has ranged from 119/85-161/90. She is oxygenating between 98 and 100% on room air. CT of her head revealed no acute intracranial findings. Her chest x-ray revealed no acute findings. Her lab data were significant for a WBC of 23.9, hemoglobin of 15.9, and potassium of 3.3. Her urinalysis revealed too numerous to count WBCs (in and out catheterization). She is being admitted for further evaluation and management.    Review of Systems:  Unable to obtain secondary to the patient's mental status.  Past Medical History  Diagnosis Date  . Smoker       5 CIGS A DAY  . Ovarian cyst   . Cervicitis   . Asthma     as child, no prob as adult, no inhaler  . Anemia     history with pregnancy, no current prob  . Anxiety     no meds  . History of ovarian cystectomy   . Abdominal hernia    Past Surgical History  Procedure Laterality Date  . Cervical biopsy  w/ loop electrode excision  02/2000  . Cesarean section  02/2001  . Wisdom tooth extraction    . Laparoscopy  04/24/2011    Procedure: LAPAROSCOPY OPERATIVE;  Surgeon: Ok Edwards, MD;  Location: [facility];  Service: Gynecology;  Laterality: N/A;  pelvic washings  . Ovarian cyst removal  04/24/2011    Procedure: OVARIAN CYSTECTOMY;  Surgeon: Ok Edwards, MD;  Location: [facility];  Service: Gynecology;  Laterality: Left;  Marland Kitchen Tympanostomy tube placement     Social History: Per history recorded, she smokes cigarettes. She uses IV drugs including heroin and cocaine.   Allergies  Allergen Reactions  . Benzoyl Peroxide Swelling    Topical  . Other Rash    Feathers, Cedar, blue grass, Benzoil topical.    Family History  Problem Relation Age of Onset  . Adopted: Yes  . Hypertension Father   . Cancer Maternal Grandmother     OVARIAN  . Breast cancer Maternal Grandmother   . Heart disease Maternal Grandmother   . Heart disease Maternal Grandfather   . Heart disease Paternal Grandmother   .  Heart disease Paternal Grandfather      Prior to Admission medications   Medication Sig Start Date End Date Taking? Authorizing Provider  clindamycin (CLEOCIN) 150 MG capsule Take 2 capsules (300 mg total) by mouth 3 (three) times daily. May dispense as  capsules Patient not taking: Reported on 09/29/2014 05/11/14   Harle Battiest, NP  HYDROcodone-acetaminophen (NORCO/VICODIN) 5-325 MG per tablet Take 2 tablets by mouth every 4 (four) hours as needed. Patient not taking: Reported on 09/29/2014 04/29/14   Elson Areas, PA-C  ibuprofen (ADVIL,MOTRIN) 800 MG tablet Take 1 tablet (800 mg  total) by mouth 3 (three) times daily. Patient not taking: Reported on 09/29/2014 04/29/14   Elson Areas, PA-C  naproxen (NAPROSYN) 500 MG tablet Take 1 tablet (500 mg total) by mouth 2 (two) times daily. Patient not taking: Reported on 09/29/2014 05/11/14   Harle Battiest, NP   Physical Exam: Filed Vitals:   09/29/14 1120 09/29/14 1210 09/29/14 1215 09/29/14 1314  BP: 119/85 161/90  148/87  Pulse: 93 121 123 119  Temp:      TempSrc:      Resp: Height:      Weight:      SpO2: 100%  100% 100%    Wt Readings from Last 3 Encounters:  09/29/14 52.164 kg (115 lb)  01/11/14 54.432 kg (120 lb)  11/03/13 52.164 kg (115 lb)    General:  [name] who is obtunded/sedated/post ictal. No acute distress. Eyes: PERRL, normal lids, irises & conjunctiva; conjunctivae are clear and sclerae are white. ENT: Lips are grossly normal. She resist mouth opening for examination. Neck: no LAD, masses or thyromegaly Cardiovascular: S1, S2, with tachycardia. No LE edema. Telemetry: Sinus tachycardia.  Respiratory: CTA bilaterally, no w/r/r. Normal respiratory effort. Abdomen: Positive bowel sounds, soft, nontender, nondistended. Skin: There is a color tattoo on her left arm; there are a few IV tracks on her right arm and some mild peak/red ecchymotic lesions on her legs. Musculoskeletal: grossly normal tone BUE/BLE; no acute hot red joints. Psychiatric: Obtunded/post ictal. Neurologic: She is obtunded/post ictal, but resist mouth opening and withdraws a little with all of her extremities to provocation. Pupils are equal and reactive to light. Very little reaction to ammonia held on her nose for few seconds.           Labs on Admission:  Basic Metabolic Panel:  Recent Labs Lab 09/29/14 0949  NA 140  K 3.3*  CL 103  CO2 22  GLUCOSE 106*  BUN 19  CREATININE 0.68  CALCIUM 9.8   Liver Function Tests:  Recent Labs Lab 09/29/14 0949  AST 92*  ALT 90*    ALKPHOS 114  BILITOT 1.0  PROT 9.4*  ALBUMIN 4.8   No results for input(s): LIPASE, AMYLASE in the last 168 hours. No results for input(s): AMMONIA in the last 168 hours. CBC:  Recent Labs Lab 09/29/14 0949  WBC 23.9*  NEUTROABS 19.0*  HGB 15.7*  HCT 46.1*  MCV 77.3*  PLT 390   Cardiac Enzymes: No results for input(s): CKTOTAL, CKMB, CKMBINDEX, TROPONINI in the last 168 hours.  BNP (last 3 results) No results for input(s): BNP in the last 8760 hours.  ProBNP (last 3 results) No results for input(s): PROBNP in the last 8760 hours.  CBG: No results for input(s): GLUCAP in the last 168 hours.  Radiological Exams on Admission: Ct Head Wo Contrast  09/29/2014   CLINICAL DATA:  Altered mental  status  EXAM: CT HEAD WITHOUT CONTRAST  TECHNIQUE: Contiguous axial images were obtained from the base of the skull through the vertex without intravenous contrast.  COMPARISON:  Aug 13, 2010  FINDINGS: The ventricles are normal in size and configuration. There is no intracranial mass, hemorrhage, extra-axial fluid collection, or midline shift. Gray-white compartments are normal. No acute infarct evident. Bony calvarium appears intact. The mastoid air cells are clear.  IMPRESSION: Study within normal limits.   Electronically Signed   By: Bretta Bang III M.D.   On: 09/29/2014 10:07   Dg Chest Port 1 View  09/29/2014   CLINICAL DATA:  Found unresponsive, vomiting, seizure  EXAM: PORTABLE CHEST - 1 VIEW  COMPARISON:  Chest x-ray of 01/11/2014  FINDINGS: No active infiltrate or effusion is seen. Mediastinal and hilar contours are unremarkable. The heart is within normal limits in size. No bony abnormality is seen.  IMPRESSION: No active disease.   Electronically Signed   By: Dwyane Dee M.D.   On: 09/29/2014 13:05    EKG: Independently reviewed. Normal sinus rhythm with nonspecific ST and T-wave abnormalities; heart rate 94 bpm.  Assessment/Plan Principal Problem:   Seizure Active  Problems:   Post-ictal state   Leukocytosis   Polysubstance abuse   Hypokalemia   1. Seizure with likely post ictal encephalopathy. The patient was found to be incontinent of her bladder and bowels and with an episode of vomiting at the local jail. She was found to be encephalopathic at the local jail and in the ED. She was witnessed to have a tonic-clonic seizure. It is unclear whether or not the patient has a history of seizures, but none has been documented in the  E-chart from previous ED visits/hospitalizations. CT of her head was unremarkable. Etiology of her seizure is unknown, but could be secondary to withdrawal syndrome from heroin and/or cocaine. She was given 1 g of IV Keppra and 1 mg of lorazepam. She is still encephalopathic which could be secondary to her post ictal state and anti-epileptic drugs. She is protecting her airway. We'll admit her to the stepdown unit for closer monitoring. We'll continue Keppra 500 mg every 12 hours and when necessary lorazepam. Seizure precautions and neuro checks every 2 hours 12 hours. [facility] consult neurology. 2. Incontinent of bladder and bowels and vomiting. Her abdomen is benign on exam. Will start IV Pepcid empirically. Zofran has been ordered when necessary for nausea and vomiting. 3. Hypokalemia. She was given one run of IV potassium in the ED. Will add potassium to her maintenance IV fluids. Will check a magnesium level to rule out deficiency. 4. Leukocytosis. Her urinalysis reveals RBCs, but not WBCs-likely secondary to catheterization. Her chest x-ray reveals no acute findings. The leukocytosis may be secondary to de-margination from the seizure. We'll continue to monitor. Will order blood cultures only if she becomes febrile; if she does, but start empiric antibiotic. 5. Reported polysubstance abuse. The patient reportedly admitted to IV heroin prior to her incarceration. Her urine drug screen was positive for cocaine, but negative for opiates.  Will order a social work consult for assistance with outpatient management. 6. The patient is incarcerated and is under the supervision of the [facility] department.    Code Status: Full code DVT Prophylaxis: SCDs Family Communication: Family not available Disposition Plan: We'll discharge when she is clinically improved and when clinically appropriate.  Time spent: One hour.  [facility] Pager [phone]

## 2014-09-29 NOTE — ED Notes (Signed)
Ambulated patient with assist x2. Patient Able to ambulate but unsteady and attempting to sit down while ambulating.

## 2014-09-30 ENCOUNTER — Inpatient Hospital Stay (HOSPITAL_COMMUNITY): Payer: Medicaid Other

## 2014-09-30 ENCOUNTER — Encounter (HOSPITAL_COMMUNITY): Payer: Self-pay | Admitting: Internal Medicine

## 2014-09-30 ENCOUNTER — Inpatient Hospital Stay (HOSPITAL_COMMUNITY)
Admit: 2014-09-30 | Discharge: 2014-09-30 | Disposition: A | Discharge status: Home / Self Care | Payer: Medicaid Other | Attending: Neurology | Admitting: Neurology

## 2014-09-30 DIAGNOSIS — M6282 Rhabdomyolysis: Secondary | ICD-10-CM

## 2014-09-30 DIAGNOSIS — R894 Abnormal immunological findings in specimens from other organs, systems and tissues: Secondary | ICD-10-CM

## 2014-09-30 DIAGNOSIS — R569 Unspecified convulsions: Secondary | ICD-10-CM | POA: Diagnosis not present

## 2014-09-30 DIAGNOSIS — R945 Abnormal results of liver function studies: Secondary | ICD-10-CM

## 2014-09-30 DIAGNOSIS — R7989 Other specified abnormal findings of blood chemistry: Secondary | ICD-10-CM

## 2014-09-30 DIAGNOSIS — N390 Urinary tract infection, site not specified: Secondary | ICD-10-CM | POA: Diagnosis present

## 2014-09-30 DIAGNOSIS — R768 Other specified abnormal immunological findings in serum: Secondary | ICD-10-CM

## 2014-09-30 DIAGNOSIS — R7689 Other specified abnormal immunological findings in serum: Secondary | ICD-10-CM

## 2014-09-30 DIAGNOSIS — E876 Hypokalemia: Secondary | ICD-10-CM | POA: Diagnosis not present

## 2014-09-30 HISTORY — DX: Rhabdomyolysis: M62.82

## 2014-09-30 HISTORY — DX: Other specified abnormal immunological findings in serum: R76.89

## 2014-09-30 HISTORY — DX: Other specified abnormal immunological findings in serum: R76.8

## 2014-09-30 LAB — COMPREHENSIVE METABOLIC PANEL
ALBUMIN: 3.4 g/dL — AB (ref 3.5–5.0)
ALK PHOS: 78 U/L (ref 38–126)
ALT: 78 U/L — ABNORMAL HIGH (ref 14–54)
ANION GAP: 9 (ref 5–15)
AST: 153 U/L — AB (ref 15–41)
BILIRUBIN TOTAL: 1.1 mg/dL (ref 0.3–1.2)
BUN: 16 mg/dL (ref 6–20)
CHLORIDE: 113 mmol/L — AB (ref 101–111)
CO2: 21 mmol/L — AB (ref 22–32)
Calcium: 8.5 mg/dL — ABNORMAL LOW (ref 8.9–10.3)
Creatinine, Ser: 0.51 mg/dL (ref 0.44–1.00)
GFR calc Af Amer: >60 mL/min (ref >60)
GFR calc non Af Amer: >60 mL/min (ref >60)
Glucose, Bld: 85 mg/dL (ref 65–99)
Potassium: 3.3 mmol/L — ABNORMAL LOW (ref 3.5–5.1)
SODIUM: 143 mmol/L (ref 135–145)
Total Protein: 6.6 g/dL (ref 6.5–8.1)

## 2014-09-30 LAB — CBC
HCT: 39.2 % (ref 36.0–46.0)
Hemoglobin: 12.8 g/dL (ref 12.0–15.0)
MCH: 25.7 pg — ABNORMAL LOW (ref 26.0–34.0)
MCHC: 32.7 g/dL (ref 30.0–36.0)
MCV: 78.7 fL (ref 78.0–100.0)
Platelets: 297 10*3/uL (ref 150–400)
RBC: 4.98 MIL/uL (ref 3.87–5.11)
RDW: 16.6 % — AB (ref 11.5–15.5)
WBC: 11.4 10*3/uL — AB (ref 4.0–10.5)

## 2014-09-30 LAB — LIPASE, BLOOD: LIPASE: 54 U/L — AB (ref 22–51)

## 2014-09-30 LAB — HEPATITIS PANEL, ACUTE
HCV Ab: 3.7 s/co ratio — ABNORMAL HIGH (ref 0.0–0.9)
HEP B C IGM: NEGATIVE
HEP B S AG: NEGATIVE
Hep A IgM: NEGATIVE

## 2014-09-30 LAB — CK: Total CK: 11385 U/L — ABNORMAL HIGH (ref 38–234)

## 2014-09-30 LAB — TSH: TSH: 1.14 u[IU]/mL (ref 0.350–4.500)

## 2014-09-30 LAB — HIV ANTIBODY (ROUTINE TESTING W REFLEX): HIV Screen 4th Generation wRfx: NONREACTIVE

## 2014-09-30 MED ORDER — FAMOTIDINE IN NACL 20-0.9 MG/50ML-% IV SOLN
20.0000 mg | Freq: Two times a day (BID) | INTRAVENOUS | Status: DC
  Administered 2014-09-30 – 2014-10-01 (×3): 20 mg via INTRAVENOUS
  Filled 2014-09-30 (×7): qty 50

## 2014-09-30 MED ORDER — POTASSIUM CHLORIDE IN NACL 40-0.9 MEQ/L-% IV SOLN
INTRAVENOUS | Status: DC
  Administered 2014-09-30 (×2): 75 mL/h via INTRAVENOUS

## 2014-09-30 MED ORDER — DEXTROSE 5 % IV SOLN
1.0000 g | INTRAVENOUS | Status: DC
  Administered 2014-09-30 – 2014-10-01 (×2): 1 g via INTRAVENOUS
  Filled 2014-09-30 (×4): qty 10

## 2014-09-30 MED ORDER — NICOTINE 14 MG/24HR TD PT24
14.0000 mg | MEDICATED_PATCH | Freq: Every day | TRANSDERMAL | Status: DC
  Administered 2014-09-30 – 2014-10-01 (×2): 14 mg via TRANSDERMAL
  Filled 2014-09-30 (×2): qty 1

## 2014-09-30 NOTE — Progress Notes (Signed)
[facility] PROGRESS NOTE  Tammy Mathis KKX:[id number] DOB: 03/25/1981 DOA: 09/29/2014 PCP: No PCP Per Patient    Code Status: Full code Family Communication: Family not available; discussed with patient Disposition Plan: Discharge when clinically appropriate   Consultants:  Neurology, Dr. Gerilyn Pilgrim  Procedures:  EEG pending  Antibiotics:  Start Rocephin 6/24  HPI/Subjective: Nursing reports that the patient became more alert overnight/early this morning. Patient has no complaints other than hunger. She does acknowledge some abdominal tenderness on exam. She had a small bowel movement overnight. She denies history of peptic ulcer disease.  Objective: Filed Vitals:   09/30/14 0800  BP:   Pulse:   Temp: 98.6 F (37 C)  Resp:    temperature 98.6. Pulse 94. Respiratory rate 28. Blood pressure 120/73. Oxygen saturation 100%.  Intake/Output Summary (Last 24 hours) at 09/30/14 1696 Last data filed at 09/30/14 0600  Gross per 24 hour  Intake 2031.25 ml  Output    225 ml  Net 1806.25 ml   Filed Weights   09/29/14 0930 09/29/14 1334 09/30/14 0500  Weight: 52.164 kg (115 lb) 51.5 kg (113 lb 8.6 oz) 51.5 kg (113 lb 8.6 oz)    Exam:   General:  [name], alert, in no acute distress.  Cardiovascular: S1, S2, no murmurs rubs or gallops.  Respiratory: Clear to auscultation bilaterally.  Abdomen: Positive bowel sounds, soft, mild epigastric tenderness with no rebound guarding or distention.  Musculoskeletal/etremities: No pedal edema. No acute hot red joints. Notable color tattoo on the left upper extremity and IV drug tracks on the right upper extremity.  Neurologic: She is alert and oriented to herself, hospital, and year. She does not recall being incarcerated. She does not recall being brought to the hospital. Cranial nerves II through XII are intact. Psychologically, she has a  flat affect. Her speech is clear.  Data Reviewed: Basic  Metabolic Panel:  Recent Labs Lab 09/29/14 0949 09/29/14 1546 09/30/14 0503  NA 140  --  143  K 3.3*  --  3.3*  CL 103  --  113*  CO2 22  --  21*  GLUCOSE 106*  --  85  BUN 19  --  16  CREATININE 0.68  --  0.51  CALCIUM 9.8  --  8.5*  MG  --  2.1  --    Liver Function Tests:  Recent Labs Lab 09/29/14 0949 09/30/14 0503  AST 92* 153*  ALT 90* 78*  ALKPHOS 114 78  BILITOT 1.0 1.1  PROT 9.4* 6.6  ALBUMIN 4.8 3.4*   No results for input(s): LIPASE, AMYLASE in the last 168 hours. No results for input(s): AMMONIA in the last 168 hours. CBC:  Recent Labs Lab 09/29/14 0949 09/30/14 0503  WBC 23.9* 11.4*  NEUTROABS 19.0*  --   HGB 15.7* 12.8  HCT 46.1* 39.2  MCV 77.3* 78.7  PLT 390 297   Cardiac Enzymes: No results for input(s): CKTOTAL, CKMB, CKMBINDEX, TROPONINI in the last 168 hours. BNP (last 3 results) No results for input(s): BNP in the last 8760 hours.  ProBNP (last 3 results) No results for input(s): PROBNP in the last 8760 hours.  CBG: No results for input(s): GLUCAP in the last 168 hours.  Recent Results (from the past 240 hour(s))  MRSA PCR Screening     Status: None   Collection Time: 09/29/14  2:00 PM  Result Value Ref Range Status   MRSA by PCR NEGATIVE NEGATIVE Final    Comment:  The GeneXpert MRSA Assay (FDA approved for NASAL specimens only), is one component of a comprehensive MRSA colonization surveillance program. It is not intended to diagnose MRSA infection nor to guide or monitor treatment for MRSA infections.      Studies: Ct Head Wo Contrast  09/29/2014   CLINICAL DATA:  Altered mental status  EXAM: CT HEAD WITHOUT CONTRAST  TECHNIQUE: Contiguous axial images were obtained from the base of the skull through the vertex without intravenous contrast.  COMPARISON:  Aug 13, 2010  FINDINGS: The ventricles are normal in size and configuration. There is no intracranial mass, hemorrhage, extra-axial fluid collection, or midline  shift. Gray-white compartments are normal. No acute infarct evident. Bony calvarium appears intact. The mastoid air cells are clear.  IMPRESSION: Study within normal limits.   Electronically Signed   By: Bretta Bang III M.D.   On: 09/29/2014 10:07   Dg Chest Port 1 View  09/29/2014   CLINICAL DATA:  Found unresponsive, vomiting, seizure  EXAM: PORTABLE CHEST - 1 VIEW  COMPARISON:  Chest x-ray of 01/11/2014  FINDINGS: No active infiltrate or effusion is seen. Mediastinal and hilar contours are unremarkable. The heart is within normal limits in size. No bony abnormality is seen.  IMPRESSION: No active disease.   Electronically Signed   By: Dwyane Dee M.D.   On: 09/29/2014 13:05    Scheduled Meds: . antiseptic oral rinse  7 mL Mouth Rinse q12n4p  . cefTRIAXone (ROCEPHIN)  IV  1 g Intravenous Q24H  . chlorhexidine  15 mL Mouth Rinse BID  . famotidine (PEPCID) IV  20 mg Intravenous Q12H  . levETIRAcetam  500 mg Intravenous Q12H  . nicotine  14 mg Transdermal Daily   Continuous Infusions: . 0.9 % NaCl with KCl 20 mEq / L 125 mL/hr at 09/30/14 0458   Assessment and plan:  Principal Problem:   Seizure Active Problems:   Post-ictal state   Leukocytosis   Polysubstance abuse   Hypokalemia   UTI (lower urinary tract infection)   Hepatitis C antibody test positive   Elevated LFTs   1. Seizure/status epilepticus. The patient had a witnessed tonic-clonic seizure in the ED. Per her account, she has had no prior history of seizures. In the ED, she was given a 1 g loading dose of Keppra and 1 mg of lorazepam. CT of her head was negative. She was subsequently started on 500 mg Keppra twice a day.  Neurologist, Dr. Gerilyn Pilgrim, was consulted and he agreed with the current management. He ordered an EEG and it is pending. Per his assessment, etiology of her seizure is suspected to be benzodiazepine withdrawal. I asked the patient about her use of Xanax, Valium, or other benzodiazepine's and she  denied using them or abusing them.  Postictal encephalopathy. Currently resolved.  Elevated LFTs with antibody positive for hepatitis C. The patient's AST was 92 and her ALT was 90 on admission. Given her history of IV drug use, and acute viral hepatitis panel and HIV antibody were ordered. HIV was nonreactive and hepatitis B serologies were negative. However, she was hepatitis C positive. She was informed of this. -She has mild epigastric abdominal pain on exam, so will order an ultrasound of the abdomen for assessment. -We will start IV Pepcid empirically. We'll give her a trial of a full liquid diet. -We will check HCV viral load.  Bacteria/possible UTI. The patient's urinalysis is noted for microhematuria, but this was thought to be secondary to the in and out  Foley catheter for the urinalysis. It revealed many bacteria, but no WBCs. She does endorse some dysuria, so will culture the urine and start Rocephin.  Leukocytosis. The patient's WBC was 23.9 on admission. Following supportive treatment and IV fluids only, it improved to 11.4. The leukocytosis was likely the consequence of the margination from the seizures; less likely UTI.  Hypokalemia. Her serum potassium is 3.3. Her magnesium level is within normal limits. We'll continue to replete potassium in the IV fluids gently and by mouth when she can tolerate it.  Polysubstance abuse. The patient has a history of IV heroin and cocaine/crack abuse.  Clinical social worker has been consulted to assist with outpatient management. Of note, she will have no access to elicit drugs as she is incarcerated.   Of note, I called the nurse at the [facility], but there was no answer. Will try again.  Time spent: 40 minutes.    [facility]  [facility] Pager [phone]. If 7PM-7AM, please contact night-coverage at [email or web address], password [facility] 09/30/2014, 9:07 AM  LOS: 1 day

## 2014-09-30 NOTE — Procedures (Signed)
  [facility] Drema Eddington A. Gerilyn Pilgrim, MD     [email or web address]           HISTORY:  The patient apparently 34 year old female who presents with altered mental status and seizures.   MEDICATIONS: Scheduled Meds: . antiseptic oral rinse  7 mL Mouth Rinse q12n4p  . cefTRIAXone (ROCEPHIN)  IV  1 g Intravenous Q24H  . chlorhexidine  15 mL Mouth Rinse BID  . famotidine (PEPCID) IV  20 mg Intravenous Q12H  . levETIRAcetam  500 mg Intravenous Q12H  . nicotine  14 mg Transdermal Daily   Continuous Infusions: . 0.9 % NaCl with KCl 40 mEq / L 75 mL/hr (09/30/14 0947)   PRN Meds:.acetaminophen **OR** acetaminophen, albuterol, LORazepam, ondansetron **OR** ondansetron (ZOFRAN) IV  Prior to Admission medications   Medication Sig Start Date End Date Taking? Authorizing Provider  clindamycin (CLEOCIN) 150 MG capsule Take 2 capsules (300 mg total) by mouth 3 (three) times daily. May dispense as 150mg  capsules Patient not taking: Reported on 09/29/2014 05/11/14   Harle Battiest, NP  HYDROcodone-acetaminophen (NORCO/VICODIN) 5-325 MG per tablet Take 2 tablets by mouth every 4 (four) hours as needed. Patient not taking: Reported on 09/29/2014 04/29/14   Elson Areas, PA-C  ibuprofen (ADVIL,MOTRIN) 800 MG tablet Take 1 tablet (800 mg total) by mouth 3 (three) times daily. Patient not taking: Reported on 09/29/2014 04/29/14   Elson Areas, PA-C  naproxen (NAPROSYN) 500 MG tablet Take 1 tablet (500 mg total) by mouth 2 (two) times daily. Patient not taking: Reported on 09/29/2014 05/11/14   Harle Battiest, NP      ANALYSIS: A 16 channel recording using standard 10 20 measurements is conducted for 23 minutes. The background activity shows significant slowing for age with the wave form been mostly 5 Hz activity. He does get as high as 7 Hz maximum but very infrequently. Photic stimulation is carried out without abnormal changes in the background activity. There is no focal or lateral  slowing. There are no epileptiform activities observed. The recording is replete with frontal intermittent rhythmic delta activity. There is also increase spindles observed. K complexes are also observed. There are also was observed triphasic waves intermittently.    IMPRESSION: This recording is markedly abnormal for the following reasons: 1. Moderately severe generalized slowing indicating a generalized encephalopathy. 2. Frontal intermittent rhythmic delta activity and triphasic waves seen typically in metabolic encephalopathies. 3. Increased spindling typically from benzodiazepines.       Chaslyn Eisen A. Gerilyn Pilgrim, M.D.  Diplomate, Biomedical engineer of Psychiatry and Neurology ( Neurology).

## 2014-09-30 NOTE — Clinical Social Work Note (Signed)
Clinical Social Work Assessment  Patient Details  Name: Tammy Mathis MRN: [id number] Date of Birth: 01/06/1981  Date of referral:  09/30/14               Reason for consult:  Substance Use/ETOH Abuse                Permission sought to share information with:    Permission granted to share information::     Name::        Agency::     Relationship::     Contact Information:     Housing/Transportation Living arrangements for the past 2 months:  Single Family Home Source of Information:  Patient Patient Interpreter Needed:  None Criminal Activity/Legal Involvement Pertinent to Current Situation/Hospitalization:  Yes (Patient is currently in jail and has an Technical sales engineer at bedside.  ) Significant Relationships:  Parents, Dependent Children Lives with:  Significant Other Do you feel safe going back to the place where you live?  Yes Need for family participation in patient care:  Yes (Comment)  Care giving concerns:  Patient is currently in jail.    Social Worker assessment / plan: Patient advised that she has a history of using heroin and cocaine.  She advised that she last used illicit drugs about three weeks ago, which is the longest time she has been sober. Patient indicated that she turned herself in to [facility] jail and she does not know how or why she is in [facility] jail.  Patient advised that she lives in [place].  She stated that her boyfriend lives with her but he is not always there.  Patient stated that her mother and father are her supports and that they are currently providing care for her 72 year old daughter.  Patient stated that she does not know how long she is going to be incarcerated due to her bond being $20,000.00. She stated that she is currently unemployed.  CSW presented patient with heroin and cocaine information sheets as well as substance abuse treatment options.  Patient advised that she believes that she is going to detox and since she is  experiencing detox in the hospital she will not use again upon release from jail.  CSW encouraged patient to go to inpatient or outpatient substance abuse treatment (upon release from jail) to assist in maintaining her sober lifestyle.  Patient stated that she went to rehab in the past and cannot remember exactly when or the name of the facility.   CSW signing off.   Employment status:  Unemployed Health and safety inspector:  Self Pay (Medicaid Pending) PT Recommendations:  Not assessed at this time Information / Referral to community resources:  Outpatient Substance Abuse Treatment Options  Patient/Family's Response to care:  Patient advised that she will consider going to rehab upon release from jail.   Patient/Family's Understanding of and Emotional Response to Diagnosis, Current Treatment, and Prognosis:  Patient does not seem to grasp the significance of the need for her to follow up with substance abuse treatment upon her release from jail.  Emotional Assessment Appearance:  Developmentally appropriate Attitude/Demeanor/Rapport:   (Cooperative) Affect (typically observed):  Calm Orientation:  Oriented to Self, Oriented to Place, Oriented to  Time, Oriented to Situation Alcohol / Substance use:  Illicit Drugs Psych involvement (Current and /or in the community):  No (Comment)  Discharge Needs  Concerns to be addressed:  Substance Abuse Concerns Readmission within the last 30 days:  No Current discharge risk:  Substance Abuse,  Legal Concerns Barriers to Discharge:  Other (Legal issues and being in jail will make going to rehab difficult/not an option currently.)   Annice Needy, LCSW 09/30/2014, 12:06 PM [phone]

## 2014-09-30 NOTE — Care Management Note (Signed)
Case Management Note  Patient Details  Name: [name] MRN: [id number] Date of Birth: [birth year]  Expected Discharge Date:  10/02/14               Expected Discharge Plan:  Corrections Facility  In-House Referral:  Clinical Social Work  Discharge planning Services  CM Consult  Post Acute Care Choice:    Choice offered to:     DME Arranged:    DME Agency:     HH Arranged:    HH Agency:     Status of Service:  Completed, signed off  Medicare Important Message Given:    Date Medicare IM Given:    Medicare IM give by:    Date Additional Medicare IM Given:    Additional Medicare Important Message give by:     If discussed at Long Length of Stay Meetings, dates discussed:    Additional Comments: Pt admitted with AMS and seizures. Pt from corrections facility and has guard at the bedside. Pt independent at baseline. Anticipate discharge back to corrections facility. If DC's over weekend, RN will need to contact facility to update them on pt's health needs. CSW consult has been done for substance abuse. No CM needs at this time.  Malcolm Metro, RN 09/30/2014, 11:40 AM

## 2014-09-30 NOTE — Progress Notes (Signed)
Patient ID: Tammy Mathis, female   DOB: 11/13/1980, 34 y.o.   MRN: [id number]  [place] A. Merlene Laughter, MD     [email or web address]          Tammy Mathis is an 34 y.o. female.   Assessment/Plan: 1. Resolving status epilepticus. Etiology is suspected to be due to benzodiazepine withdrawal. 2. Opiate withdrawal but unlikely to be the etiology of seizures. 3. Altered mental status due to above. 4. Polysubstance drug abuse including opioids and heroine and benzodiazepines.   RECOMMENDATION: Continue keppra for now although EEG - no epileptiform activity. OK for discharge 10-01-14     She c/o abdominal pain. Endorses some loose stool.  GENERAL: No acute distress.  HEENT: Supple. Atraumatic normocephalic.   ABDOMEN: soft  EXTREMITIES: No edema. Multiple track marks involving the dorsum of the hands bilaterally.   BACK: Normal.  SKIN: Normal by inspection.   MENTAL STATUS: She does awaken spontaneously. She knows that she is in the hospital. A lot more coherent and lucid.   CRANIAL NERVES: Pupils are equal, round and reactive to light; extra ocular movements are full, there is no significant nystagmus; visual fields are full; upper and lower facial muscles are normal in strength and symmetric, there is no flattening of the nasolabial folds; tongue is midline; uvula is midline; shoulder elevation is normal.  MOTOR: Normal tone, bulk and strength; no pronator drift.  COORDINATION: There is no dysmetria. There is no tremors.   SENSATION: Normal to pain stimuli.      Objective: Vital signs in last 24 hours: Temp:  [97.4 F (36.3 C)-99 F (37.2 C)] 99 F (37.2 C) (06/24 1359) Pulse Rate:  [61-101] 73 (06/24 1359) Resp:  [17-32] 22 (06/24 1359) BP: (107-130)/(64-105) 125/81 mmHg (06/24 1359) SpO2:  [92 %-100 %] 100 % (06/24 1359) Weight:  [51.5 kg (113 lb 8.6 oz)] 51.5 kg (113 lb 8.6 oz) (06/24 0500)  Intake/Output from previous day: 06/23  0701 - 06/24 0700 In: 2031.3 [I.V.:2031.3] Out: 225 [Urine:225] Intake/Output this shift:   Nutritional status: Diet regular Room service appropriate?: Yes; Fluid consistency:: Thin   Lab Results: Results for orders placed or performed during the hospital encounter of 09/29/14 (from the past 48 hour(s))  Comprehensive metabolic panel     Status: Abnormal   Collection Time: 09/29/14  9:49 AM  Result Value Ref Range   Sodium 140 135 - 145 mmol/L   Potassium 3.3 (L) 3.5 - 5.1 mmol/L   Chloride 103 101 - 111 mmol/L   CO2 22 22 - 32 mmol/L   Glucose, Bld 106 (H) 65 - 99 mg/dL   BUN 19 6 - 20 mg/dL   Creatinine, Ser 0.68 0.44 - 1.00 mg/dL   Calcium 9.8 8.9 - 10.3 mg/dL   Total Protein 9.4 (H) 6.5 - 8.1 g/dL   Albumin 4.8 3.5 - 5.0 g/dL   AST 92 (H) 15 - 41 U/L   ALT 90 (H) 14 - 54 U/L   Alkaline Phosphatase 114 38 - 126 U/L   Total Bilirubin 1.0 0.3 - 1.2 mg/dL   GFR calc non Af Amer >60 >60 mL/min   GFR calc Af Amer >60 >60 mL/min    Comment: (NOTE) The eGFR has been calculated using the CKD EPI equation. This calculation has not been validated in all clinical situations. eGFR's persistently <60 mL/min signify possible Chronic Kidney Disease.    Anion gap 15 5 - 15  CBC with Differential     Status:  Abnormal   Collection Time: 09/29/14  9:49 AM  Result Value Ref Range   WBC 23.9 (H) 4.0 - 10.5 K/uL   RBC 5.96 (H) 3.87 - 5.11 MIL/uL   Hemoglobin 15.7 (H) 12.0 - 15.0 g/dL   HCT 46.1 (H) 36.0 - 46.0 %   MCV 77.3 (L) 78.0 - 100.0 fL   MCH 26.3 26.0 - 34.0 pg   MCHC 34.1 30.0 - 36.0 g/dL   RDW 16.2 (H) 11.5 - 15.5 %   Platelets 390 150 - 400 K/uL   Neutrophils Relative % 80 (H) 43 - 77 %   Neutro Abs 19.0 (H) 1.7 - 7.7 K/uL   Lymphocytes Relative 13 12 - 46 %   Lymphs Abs 3.0 0.7 - 4.0 K/uL   Monocytes Relative 7 3 - 12 %   Monocytes Absolute 1.8 (H) 0.1 - 1.0 K/uL   Eosinophils Relative 0 0 - 5 %   Eosinophils Absolute 0.0 0.0 - 0.7 K/uL   Basophils Relative 0 0 - 1 %     Basophils Absolute 0.1 0.0 - 0.1 K/uL   WBC Morphology ATYPICAL LYMPHOCYTES   Urinalysis, Routine w reflex microscopic (not at [facility])     Status: Abnormal   Collection Time: 09/29/14 10:30 AM  Result Value Ref Range   Color, Urine YELLOW YELLOW   APPearance CLEAR CLEAR   Specific Gravity, Urine 1.025 1.005 - 1.030   pH 6.0 5.0 - 8.0   Glucose, UA NEGATIVE NEGATIVE mg/dL   Hgb urine dipstick LARGE (A) NEGATIVE   Bilirubin Urine NEGATIVE NEGATIVE   Ketones, ur TRACE (A) NEGATIVE mg/dL   Protein, ur 100 (A) NEGATIVE mg/dL   Urobilinogen, UA 0.2 0.0 - 1.0 mg/dL   Nitrite NEGATIVE NEGATIVE   Leukocytes, UA NEGATIVE NEGATIVE  Pregnancy, urine     Status: None   Collection Time: 09/29/14 10:30 AM  Result Value Ref Range   Preg Test, Ur NEGATIVE NEGATIVE  Urine rapid drug screen (hosp performed)     Status: Abnormal   Collection Time: 09/29/14 10:30 AM  Result Value Ref Range   Opiates NONE DETECTED NONE DETECTED   Cocaine POSITIVE (A) NONE DETECTED   Benzodiazepines NONE DETECTED NONE DETECTED   Amphetamines NONE DETECTED NONE DETECTED   Tetrahydrocannabinol NONE DETECTED NONE DETECTED   Barbiturates NONE DETECTED NONE DETECTED    Comment:        DRUG SCREEN FOR MEDICAL PURPOSES ONLY.  IF CONFIRMATION IS NEEDED FOR ANY PURPOSE, NOTIFY LAB WITHIN 5 DAYS.        LOWEST DETECTABLE LIMITS FOR URINE DRUG SCREEN Drug Class       Cutoff (ng/mL) Amphetamine      1000 Barbiturate      200 Benzodiazepine   333 Tricyclics       545 Opiates          300 Cocaine          300 THC              50   Urine microscopic-add on     Status: Abnormal   Collection Time: 09/29/14 10:30 AM  Result Value Ref Range   RBC / HPF TOO NUMEROUS TO COUNT <3 RBC/hpf   Bacteria, UA MANY (A) RARE  MRSA PCR Screening     Status: None   Collection Time: 09/29/14  2:00 PM  Result Value Ref Range   MRSA by PCR NEGATIVE NEGATIVE    Comment:  The GeneXpert MRSA Assay (FDA approved for NASAL  specimens only), is one component of a comprehensive MRSA colonization surveillance program. It is not intended to diagnose MRSA infection nor to guide or monitor treatment for MRSA infections.   HIV antibody     Status: None   Collection Time: 09/29/14  3:46 PM  Result Value Ref Range   HIV Screen 4th Generation wRfx Non Reactive Non Reactive    Comment: (NOTE) Performed At: [facility] [place], [place] [id number] Lindon Romp MD NW:[id number]   Hepatitis panel, acute     Status: Abnormal   Collection Time: 09/29/14  3:46 PM  Result Value Ref Range   Hepatitis B Surface Ag Negative Negative   HCV Ab 3.7 (H) 0.0 - 0.9 s/co ratio    Comment: (NOTE)                                  Negative:     < 0.8                             Indeterminate: 0.8 - 0.9                                  Positive:     > 0.9 The CDC recommends that a positive HCV antibody result be followed up with a HCV Nucleic Acid Amplification test ([id number]). Performed At: [facility] [place], [place] [id number] Lindon Romp MD KG:[id number]    Hep A IgM Negative Negative   Hep B C IgM Negative Negative  Magnesium     Status: None   Collection Time: 09/29/14  3:46 PM  Result Value Ref Range   Magnesium 2.1 1.7 - 2.4 mg/dL  TSH     Status: None   Collection Time: 09/30/14  5:03 AM  Result Value Ref Range   TSH 1.140 0.350 - 4.500 uIU/mL  Comprehensive metabolic panel     Status: Abnormal   Collection Time: 09/30/14  5:03 AM  Result Value Ref Range   Sodium 143 135 - 145 mmol/L   Potassium 3.3 (L) 3.5 - 5.1 mmol/L   Chloride 113 (H) 101 - 111 mmol/L   CO2 21 (L) 22 - 32 mmol/L   Glucose, Bld 85 65 - 99 mg/dL   BUN 16 6 - 20 mg/dL   Creatinine, Ser 0.51 0.44 - 1.00 mg/dL   Calcium 8.5 (L) 8.9 - 10.3 mg/dL   Total Protein 6.6 6.5 - 8.1 g/dL   Albumin 3.4 (L) 3.5 - 5.0 g/dL   AST 153 (H) 15 - 41 U/L   ALT 78 (H) 14 - 54 U/L   Alkaline  Phosphatase 78 38 - 126 U/L   Total Bilirubin 1.1 0.3 - 1.2 mg/dL   GFR calc non Af Amer >60 >60 mL/min   GFR calc Af Amer >60 >60 mL/min    Comment: (NOTE) The eGFR has been calculated using the CKD EPI equation. This calculation has not been validated in all clinical situations. eGFR's persistently <60 mL/min signify possible Chronic Kidney Disease.    Anion gap 9 5 - 15  CBC     Status: Abnormal   Collection Time: 09/30/14  5:03 AM  Result Value Ref Range   WBC 11.4 (H) 4.0 -  10.5 K/uL   RBC 4.98 3.87 - 5.11 MIL/uL   Hemoglobin 12.8 12.0 - 15.0 g/dL   HCT 39.2 36.0 - 46.0 %   MCV 78.7 78.0 - 100.0 fL   MCH 25.7 (L) 26.0 - 34.0 pg   MCHC 32.7 30.0 - 36.0 g/dL   RDW 16.6 (H) 11.5 - 15.5 %   Platelets 297 150 - 400 K/uL  Lipase, blood     Status: Abnormal   Collection Time: 09/30/14  5:03 AM  Result Value Ref Range   Lipase 54 (H) 22 - 51 U/L    Lipid Panel No results for input(s): CHOL, TRIG, HDL, CHOLHDL, VLDL, LDLCALC in the last 72 hours.  Studies/Results:  EEG This recording is markedly abnormal for the following reasons: 1. Moderately severe generalized slowing indicating a generalized encephalopathy. 2. Frontal intermittent rhythmic delta activity and triphasic waves seen typically in metabolic encephalopathies. 3. Increased spindling typically from benzodiazepines.   Medications:  Scheduled Meds: . antiseptic oral rinse  7 mL Mouth Rinse q12n4p  . cefTRIAXone (ROCEPHIN)  IV  1 g Intravenous Q24H  . chlorhexidine  15 mL Mouth Rinse BID  . famotidine (PEPCID) IV  20 mg Intravenous Q12H  . levETIRAcetam  500 mg Intravenous Q12H  . nicotine  14 mg Transdermal Daily   Continuous Infusions: . 0.9 % NaCl with KCl 40 mEq / L 75 mL/hr (09/30/14 0947)   PRN Meds:.acetaminophen **OR** acetaminophen, albuterol, LORazepam, ondansetron **OR** ondansetron (ZOFRAN) IV     LOS: 1 day   Akiel Fennell A. Merlene Laughter, M.D.  Diplomate, Tax adviser of Psychiatry and Neurology (  Neurology).

## 2014-09-30 NOTE — Progress Notes (Signed)
EEG completed; results pending.    

## 2014-10-01 ENCOUNTER — Inpatient Hospital Stay (HOSPITAL_COMMUNITY): Payer: Medicaid Other

## 2014-10-01 ENCOUNTER — Encounter (HOSPITAL_COMMUNITY): Payer: Self-pay | Admitting: Internal Medicine

## 2014-10-01 DIAGNOSIS — R569 Unspecified convulsions: Secondary | ICD-10-CM | POA: Diagnosis not present

## 2014-10-01 DIAGNOSIS — M6282 Rhabdomyolysis: Secondary | ICD-10-CM | POA: Diagnosis present

## 2014-10-01 DIAGNOSIS — R7989 Other specified abnormal findings of blood chemistry: Secondary | ICD-10-CM | POA: Diagnosis not present

## 2014-10-01 DIAGNOSIS — R1013 Epigastric pain: Secondary | ICD-10-CM | POA: Diagnosis not present

## 2014-10-01 DIAGNOSIS — R894 Abnormal immunological findings in specimens from other organs, systems and tissues: Secondary | ICD-10-CM | POA: Diagnosis not present

## 2014-10-01 LAB — BASIC METABOLIC PANEL
Anion gap: 7 (ref 5–15)
BUN: 7 mg/dL (ref 6–20)
CALCIUM: 8.6 mg/dL — AB (ref 8.9–10.3)
CO2: 23 mmol/L (ref 22–32)
Chloride: 112 mmol/L — ABNORMAL HIGH (ref 101–111)
Creatinine, Ser: 0.55 mg/dL (ref 0.44–1.00)
GFR calc Af Amer: >60 mL/min (ref >60)
GFR calc non Af Amer: >60 mL/min (ref >60)
Glucose, Bld: 107 mg/dL — ABNORMAL HIGH (ref 65–99)
POTASSIUM: 3.5 mmol/L (ref 3.5–5.1)
Sodium: 142 mmol/L (ref 135–145)

## 2014-10-01 LAB — HEPATIC FUNCTION PANEL
ALBUMIN: 3.4 g/dL — AB (ref 3.5–5.0)
ALT: 81 U/L — ABNORMAL HIGH (ref 14–54)
AST: 143 U/L — ABNORMAL HIGH (ref 15–41)
Alkaline Phosphatase: 70 U/L (ref 38–126)
BILIRUBIN TOTAL: 0.7 mg/dL (ref 0.3–1.2)
Total Protein: 6.6 g/dL (ref 6.5–8.1)

## 2014-10-01 LAB — CBC
HEMATOCRIT: 38.7 % (ref 36.0–46.0)
Hemoglobin: 12.5 g/dL (ref 12.0–15.0)
MCH: 25.8 pg — ABNORMAL LOW (ref 26.0–34.0)
MCHC: 32.3 g/dL (ref 30.0–36.0)
MCV: 79.8 fL (ref 78.0–100.0)
Platelets: 276 10*3/uL (ref 150–400)
RBC: 4.85 MIL/uL (ref 3.87–5.11)
RDW: 16.8 % — AB (ref 11.5–15.5)
WBC: 8.4 10*3/uL (ref 4.0–10.5)

## 2014-10-01 LAB — CK: Total CK: 7319 U/L — ABNORMAL HIGH (ref 38–234)

## 2014-10-01 MED ORDER — LEVETIRACETAM 250 MG PO TABS: 250.0000 mg | ORAL_TABLET | Freq: Two times a day (BID) | ORAL | Status: DC

## 2014-10-01 MED ORDER — ACETAMINOPHEN 325 MG PO TABS: 650.0000 mg | ORAL_TABLET | Freq: Four times a day (QID) | ORAL | Status: DC | PRN

## 2014-10-01 MED ORDER — ACETAMINOPHEN 325 MG PO TABS: 325.0000 mg | ORAL_TABLET | Freq: Four times a day (QID) | ORAL | Status: DC | PRN

## 2014-10-01 MED ORDER — LEVETIRACETAM 250 MG PO TABS
250.0000 mg | ORAL_TABLET | Freq: Two times a day (BID) | ORAL | Status: DC
  Administered 2014-10-01: 250 mg via ORAL
  Filled 2014-10-01: qty 1

## 2014-10-01 MED ORDER — FAMOTIDINE 20 MG PO TABS: 20.0000 mg | ORAL_TABLET | Freq: Two times a day (BID) | ORAL | Status: DC

## 2014-10-01 MED ORDER — MORPHINE SULFATE 2 MG/ML IJ SOLN
2.0000 mg | INTRAMUSCULAR | Status: DC | PRN
  Administered 2014-10-01 (×2): 2 mg via INTRAVENOUS
  Filled 2014-10-01 (×2): qty 1

## 2014-10-01 MED ORDER — IOHEXOL 300 MG/ML  SOLN
50.0000 mL | Freq: Once | INTRAMUSCULAR | Status: AC | PRN
  Administered 2014-10-01: 50 mL via ORAL

## 2014-10-01 MED ORDER — CEFUROXIME AXETIL 250 MG PO TABS
250.0000 mg | ORAL_TABLET | Freq: Two times a day (BID) | ORAL | Status: DC
Start: 2014-10-01 — End: 2016-09-29

## 2014-10-01 NOTE — Discharge Summary (Addendum)
Physician Discharge Summary  Tammy Mathis EHU:[id number] DOB: 01/04/1981 DOA: 09/29/2014  PCP: No PCP Per Patient  Admit date: 09/29/2014 Discharge date: 10/01/2014  Time spent: Greater than 30 minutes  Recommendations for Outpatient Follow-up:  1. Patient is currently incarcerated and was discharged to [facility]. 2. Recommend follow-up with neurologist Dr. Gerilyn Pilgrim in 4-6 weeks.  3. Upon release from jail, the patient should be instructed to not drive until she follows up with neurology. 4. Recommend treating patients pain with Tylenol (acetaminophen) as directed, but 2 g or less per day; try to avoid NSAIDs. 5. Hepatitis C virus load is pending.   Discharge Diagnoses:  1. Tonic-clonic seizures/status epilepticus. Etiology possibly secondary to heroin withdrawal, but benzodiazepine withdrawal could not be ruled out (patient denied recent history of benzodiazepine use). 2. Mild rhabdomyolysis, likely secondary to seizures. 3. Elevated LFTs, more likely secondary to rhabdomyolysis. 4. Hepatitis C positive. 5. Leukocytosis secondary to seizure. 6. Mild UTI. 7. Mild epigastric abdominal pain, possibly secondary to gastritis. 8. Acute encephalopathy secondary to post ictal state. 9. Polysubstance abuse with IV heroin and cocaine. 10. Hypokalemia. Repleted. 11. Currently incarcerated.   Discharge Condition: Improved.  Diet recommendation: Heart healthy.  Filed Weights   09/29/14 1334 09/30/14 0500 10/01/14 0827  Weight: 51.5 kg (113 lb 8.6 oz) 51.5 kg (113 lb 8.6 oz) 51.075 kg (112 lb 9.6 oz)    History of present illness:   Tammy Mathis is a 34 y.o. female with a history of polysubstance abuse including IV heroin and cocaine, asthma, anemia, and chronic anxiety. She was obtunded/postictal in the ED and was unable to provide any history. The history was provided by the E chart, ED M.D. and staff, and by [employer]. Accordingly, the patient was incarcerated  2-3 days ago for violating her probation. Apparently, she complained of a global headache and disclosed that she had used IV heroin sometime prior to her incarceration.Marland Kitchen She was found moaning in her jail cell. She was also found to be incontinent of her bladder and bowels. There was also some vomiting. She was too weak to get up. She was brought to the ED. During her stay in the ED, she had a witnessed tonic-clonic seizure. She was given 1 mg of lorazepam and 1 g of Keppra. In the ED, she was mildly tachycardic with a heart rate ranging from 108-123. Her blood pressure ranged from 119/85-161/90. She was oxygenating between 98 and 100% on room air. CT of her head revealed no acute intracranial findings. Her chest x-ray revealed no acute findings. Her lab data were significant for a WBC of 23.9, hemoglobin of 15.9, and potassium of 3.3. Her urinalysis revealed too numerous to count RBCs (in and out catheterization). She was admitted for further evaluation and management.     Hospital Course:   1. New onset seizure/status epilepticus and resultant post ictal encephalopathy. The patient had a witnessed tonic-clonic seizure in the ED. Following resolution of her post ictal encephalopathy, she denied any prior history of seizures.  In the ED, she was given a 1 g loading dose of Keppra and 1 mg of lorazepam. CT of her head was negative. She was subsequently started on 500 mg Keppra twice a day.  Neurologist, Dr. Gerilyn Pilgrim, was consulted and he agreed with the current management. He ordered an EEG. Per his assessment, etiology of her seizure was suspected to be benzodiazepine withdrawal. I asked the patient about her use of Xanax, Valium, or other benzodiazepine's and she  denied using them or abusing them. Patient's EEG results are dictated below under "procedures". There was no evidence of epileptiform activity, but with encephalopathy which could've been secondary to the lorazepam and the loading dose of  Keppra she received in the ED. Dr. Gerilyn Pilgrim recommended to continue Keppra, although the EEG did not reveal any epileptiform activity. The patient had no seizures during hospitalization. Would recommend continued treatment with Keppra and follow-up with Dr. Gerilyn Pilgrim in 4-6 weeks. Rhabdomyolysis Total CK was ordered following admission because of her tonic-clonic seizure. It was elevated at 11,385. She was started on vigorous IV fluids. Her total CK improved to 7319. With continued oral fluid intake, her CK should normalize over the next week or 2. Elevated LFTs; Hepatitis C antibody positivity. The patient's liver enzymes were mildly to moderately elevated. Given her history of IV drug use and tattoo, an acute viral hepatitis panel and HIV antibody were ordered. HIV was nonreactive and hepatitis A and B serologies were negative. However, she was hepatitis C positive. She was informed of this. HCV viral load was ordered, but was pending at the time of discharge. The elevated LFTs were trending downward, but were still mildly to moderately elevated. The etiology was more likely from rhabdomyolysis than from hepatitis C positivity. Bacteria/possible UTI. The patient's urinalysis was noted for microhematuria, but this was thought to be secondary to the in and out bladder catheter for the urinalysis. It revealed many bacteria, but no WBCs. She did endorse some dysuria, so Rocephin was started. Urine culture was still pending, but was negative to date. She was discharged on several more days of Ceftin. Epigastric abdominal pain, possibly secondary to gastritis. The patient was started on Pepcid empirically given her history of NSAID use. Ultrasound of her abdomen was ordered and revealed no evidence of gallstones or sonographic Murphy sign. Subsequently, CT of her abdomen and pelvis with oral contrast was ordered and it revealed no significant findings to explain her pain. Of note, the patient has a history of  chronic pelvic pain. She was discharged on Pepcid for possible gastritis. Leukocytosis. The patient's WBC was 23.9 on admission. Following supportive treatment and IV fluids only, it normalized. The leukocytosis was likely the consequence of the margination from the seizures; less likely from the mild UTI. Hypokalemia. Her serum potassium was 3.3. Her magnesium level was within normal limits. She was supplemented in the IV fluids and orally. Polysubstance abuse. The patient has a history of IV heroin and cocaine/crack abuse.  Clinical social worker was consulted to assist with services that could help her to remain sober as an outpatient. Of note, she will have no access to elicit drugs as she is incarcerated.    Procedures: EEG: Results per Dr. Gerilyn Pilgrim:This recording is markedly abnormal for the following reasons:  Moderately severe generalized slowing indicating a generalized encephalopathy.  Frontal intermittent rhythmic delta activity and triphasic waves seen typically in metabolic encephalopathies.  Increased spindling typically from benzodiazepines.  Consultations:  Neurologist, Dr. Gerilyn Pilgrim   Discharge Exam: Filed Vitals:   10/01/14 1422  BP: 90/60  Pulse: 72  Temp: 98.5 F (36.9 C)  Resp: 20   oxygen saturation 100% on room air.   General: [name], alert, in no acute distress.  Cardiovascular: S1, S2, no murmurs rubs or gallops.  Respiratory: Clear to auscultation bilaterally.  Abdomen: Positive bowel sounds, soft, mild epigastric tenderness with no rebound guarding or distention.  Musculoskeletal/etremities: No pedal edema. No acute hot red joints. Notable colored tattoo  on the left upper extremity and IV drug tracks on the right upper extremity.  Neurologic: She is alert and oriented to herself, hospital, and year. She does not recall being incarcerated. She does not recall being brought to the hospital. Cranial nerves II through XII are  intact. Psychologically, she has a flat affect. Her speech is clear.  Discharge Instructions   Discharge Instructions    Diet - low sodium heart healthy    Complete by:  As directed      Increase activity slowly    Complete by:  As directed           Current Discharge Medication List    START taking these medications   Details  acetaminophen (TYLENOL) 325 MG tablet Take 1 tablet (325 mg total) by mouth every 6 (six) hours as needed for mild pain, fever or headache (or Fever >/= 101).    cefUROXime (CEFTIN) 250 MG tablet Take 1 tablet (250 mg total) by mouth 2 (two) times daily with a meal. Antibiotic to be taken for 4 more days, starting Sunday 10/02/14. Qty: 8 tablet, Refills: 0    famotidine (PEPCID) 20 MG tablet Take 1 tablet (20 mg total) by mouth 2 (two) times daily. Qty: 60 tablet, Refills: 1    levETIRAcetam (KEPPRA) 250 MG tablet Take 1 tablet (250 mg total) by mouth 2 (two) times daily. For treatment of seizure disorder. Qty: 60 tablet, Refills: 3      STOP taking these medications     clindamycin (CLEOCIN) 150 MG capsule      HYDROcodone-acetaminophen (NORCO/VICODIN) 5-325 MG per tablet      ibuprofen (ADVIL,MOTRIN) 800 MG tablet      naproxen (NAPROSYN) 500 MG tablet        Allergies  Allergen Reactions  . Benzoyl Peroxide Swelling    Topical  . Other Rash    Feathers, Cedar, blue grass, Benzoil topical.   Follow-up Information    Schedule an appointment as soon as possible for a visit with [facility], KOFI, MD.   Specialty:  Neurology   Why:  To be seen in 4-6 weeks.   Contact information:   [address] [place] [address] [phone]        The results of significant diagnostics from this hospitalization (including imaging, microbiology, ancillary and laboratory) are listed below for reference.    Significant Diagnostic Studies: Ct Abdomen Pelvis Wo Contrast  10/01/2014   CLINICAL DATA:  Epigastric pain.  Hepatitis-C.  EXAM: CT  ABDOMEN AND PELVIS WITHOUT CONTRAST  TECHNIQUE: Multidetector CT imaging of the abdomen and pelvis was performed following the standard protocol without IV contrast.  COMPARISON:  Multiple exams, including 09/30/2014 and 03/06/2014  FINDINGS: Lower chest:  Unremarkable  Hepatobiliary: Contracted gallbladder.  Pancreas: Slightly indistinct pancreatic margins possibly attributable to the paucity of intra-abdominal adipose tissue rather than necessarily being from pancreatitis. The new correlate with pancreatic enzyme levels.  Spleen: Unremarkable  Adrenals/Urinary Tract: Slightly distended urinary bladder. No stones identified.  Stomach/Bowel: Unremarkable.  Appendix normal.  Vascular/Lymphatic: Small retroperitoneal lymph nodes are not pathologically enlarged by size criteria. Right external iliac node 0.7 cm in short axis, within normal limits.  Reproductive: Unremarkable  Other: Trace free pelvic fluid in the cul-de-sac  Musculoskeletal: Unremarkable  IMPRESSION: 1. Slightly indistinct pancreatic margins-correlate with pancreatic enzyme levels in assessing for pancreatitis. More likely this appearance is simply due to the paucity of intra-abdominal adipose tissue. 2. Trace amount of fluid in the cul-de-sac, potentially physiologic.  3.  Otherwise, no significant abnormalities are observed.   Electronically Signed   By: Gaylyn Rong M.D.   On: 10/01/2014 13:14   Ct Head Wo Contrast  09/29/2014   CLINICAL DATA:  Altered mental status  EXAM: CT HEAD WITHOUT CONTRAST  TECHNIQUE: Contiguous axial images were obtained from the base of the skull through the vertex without intravenous contrast.  COMPARISON:  Aug 13, 2010  FINDINGS: The ventricles are normal in size and configuration. There is no intracranial mass, hemorrhage, extra-axial fluid collection, or midline shift. Gray-white compartments are normal. No acute infarct evident. Bony calvarium appears intact. The mastoid air cells are clear.  IMPRESSION: Study  within normal limits.   Electronically Signed   By: Bretta Bang III M.D.   On: 09/29/2014 10:07   US Abdomen Complete  09/30/2014   CLINICAL DATA:  Elevated LFTs  EXAM: ULTRASOUND ABDOMEN COMPLETE  COMPARISON:  CT 03/06/2014  FINDINGS: Gallbladder: No gallstones. The gallbladder is contracted which accentuates the gallbladder wall thickness, measuring 3-4 mm. Negative sonographic Murphy's.  Common bile duct: Diameter: Normal caliber, 3 mm.  Liver: No focal lesion identified. Within normal limits in parenchymal echogenicity.  IVC: No abnormality visualized.  Pancreas: Visualized portion unremarkable.  Spleen: Size and appearance within normal limits.  Right Kidney: Length: 11.8 cm. Echogenicity within normal limits. No mass or hydronephrosis visualized.  Left Kidney: Length: 11.8 cm. Echogenicity within normal limits. No mass or hydronephrosis visualized.  Abdominal aorta: No aneurysm visualized.  Other findings: None.  IMPRESSION: Contracted gallbladder which accentuates gallbladder wall thickness. No stones or sonographic Murphy's sign.  No acute findings.   Electronically Signed   By: Charlett Nose M.D.   On: 09/30/2014 10:39   Dg Chest Port 1 View  09/29/2014   CLINICAL DATA:  Found unresponsive, vomiting, seizure  EXAM: PORTABLE CHEST - 1 VIEW  COMPARISON:  Chest x-ray of 01/11/2014  FINDINGS: No active infiltrate or effusion is seen. Mediastinal and hilar contours are unremarkable. The heart is within normal limits in size. No bony abnormality is seen.  IMPRESSION: No active disease.   Electronically Signed   By: Dwyane Dee M.D.   On: 09/29/2014 13:05    Microbiology: Recent Results (from the past 240 hour(s))  MRSA PCR Screening     Status: None   Collection Time: 09/29/14  2:00 PM  Result Value Ref Range Status   MRSA by PCR NEGATIVE NEGATIVE Final    Comment:        The GeneXpert MRSA Assay (FDA approved for NASAL specimens only), is one component of a comprehensive MRSA  colonization surveillance program. It is not intended to diagnose MRSA infection nor to guide or monitor treatment for MRSA infections.   Culture, Urine     Status: None (Preliminary result)   Collection Time: 09/30/14  1:40 PM  Result Value Ref Range Status   Specimen Description URINE, CLEAN CATCH  Final   Special Requests NONE  Final   Culture   Final    NO GROWTH < 24 HOURS Performed at [facility]    Report Status PENDING  Incomplete     Labs: Basic Metabolic Panel:  Recent Labs Lab 09/29/14 0949 09/29/14 1546 09/30/14 0503 10/01/14 0627  NA 140  --  143 142  K 3.3*  --  3.3* 3.5  CL 103  --  113* 112*  CO2 22  --  21* 23  GLUCOSE 106*  --  85 107*  BUN 19  --  16 7  CREATININE 0.68  --  0.51 0.55  CALCIUM 9.8  --  8.5* 8.6*  MG  --  2.1  --   --    Liver Function Tests:  Recent Labs Lab 09/29/14 0949 09/30/14 0503 10/01/14 0627  AST 92* 153* 143*  ALT 90* 78* 81*  ALKPHOS 114 78 70  BILITOT 1.0 1.1 0.7  PROT 9.4* 6.6 6.6  ALBUMIN 4.8 3.4* 3.4*    Recent Labs Lab 09/30/14 0503  LIPASE 54*   No results for input(s): AMMONIA in the last 168 hours. CBC:  Recent Labs Lab 09/29/14 0949 09/30/14 0503 10/01/14 0627  WBC 23.9* 11.4* 8.4  NEUTROABS 19.0*  --   --   HGB 15.7* 12.8 12.5  HCT 46.1* 39.2 38.7  MCV 77.3* 78.7 79.8  PLT 390 297 276   Cardiac Enzymes:  Recent Labs Lab 09/30/14 0503 10/01/14 0627  CKTOTAL [address]* 7319*   BNP: BNP (last 3 results) No results for input(s): BNP in the last 8760 hours.  ProBNP (last 3 results) No results for input(s): PROBNP in the last 8760 hours.  CBG: No results for input(s): GLUCAP in the last 168 hours.     Signed:  Falisha Osment  [facility] 10/01/2014, 4:37 PM

## 2014-10-01 NOTE — Progress Notes (Signed)
[facility] via officer at bedside.. Reviewed discharge instructions with pt and answered questions.  Also called and spoke with Donnita Falls LPN at facility to update her on pt status and discharge instructions.  Will continue to monitor pt until leaves the floor.

## 2014-10-02 LAB — URINE CULTURE: Culture: NO GROWTH

## 2014-10-04 LAB — HCV RNA (INTERNATIONAL UNITS)
HCV LOG10: 7.007 {Log_IU}/mL
HCV RNA (INTERNATIONAL UNITS): 10159000 [IU]/mL

## 2014-10-04 LAB — HCV RNA QUANT

## 2015-11-04 IMAGING — US US ABDOMEN COMPLETE
1 series · 14 of 25 positions shown · non-contrast
Comparison: CT 03/06/2014

CLINICAL DATA: Elevated LFTs

EXAM:
ULTRASOUND ABDOMEN COMPLETE

[Series 1: us abdomen complete · 0.14mm/px · 14 of 102 slices shown]
[im 1/102]
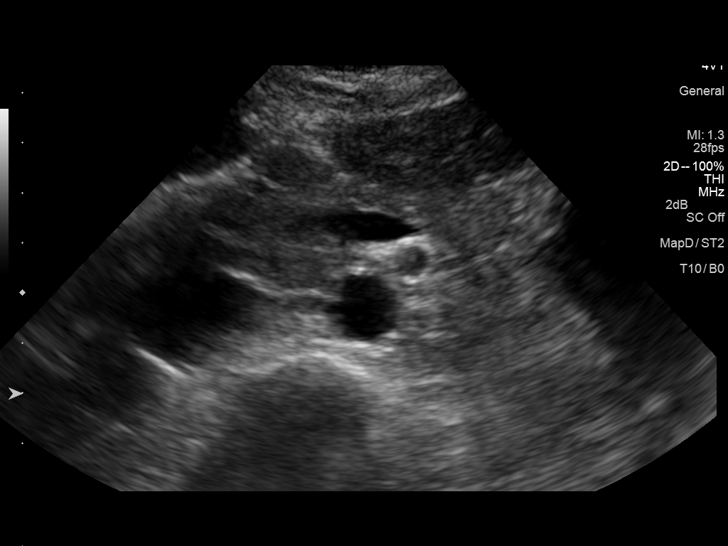
[im 9/102]
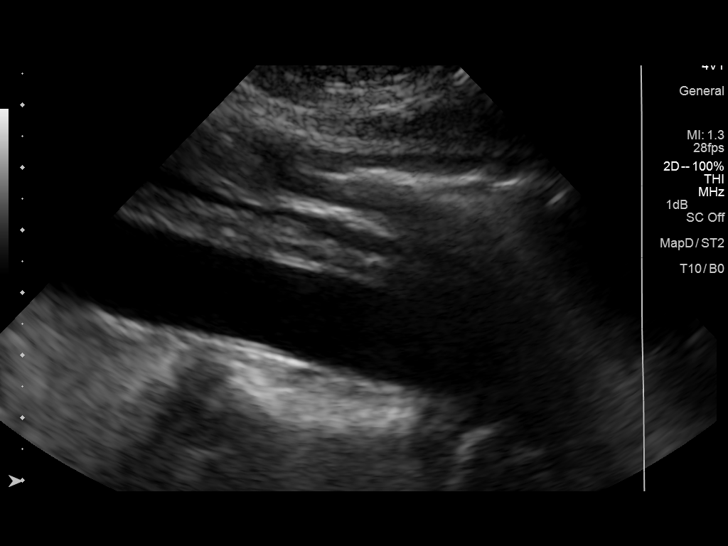
[im 17/102]
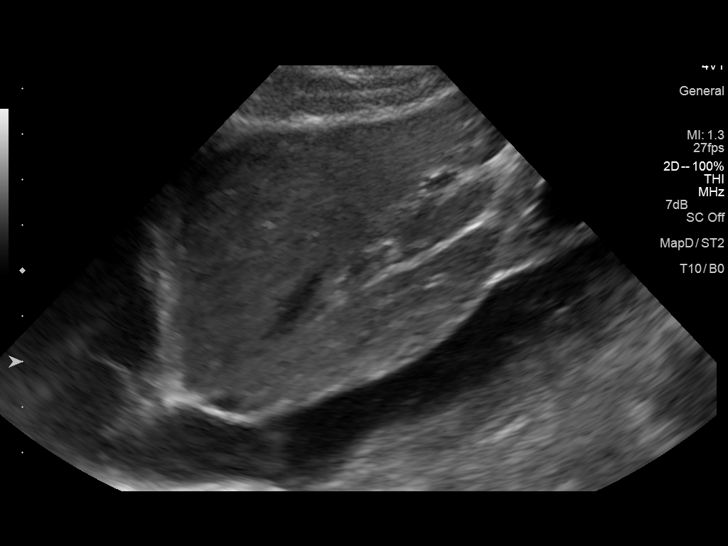
[im 26/102]
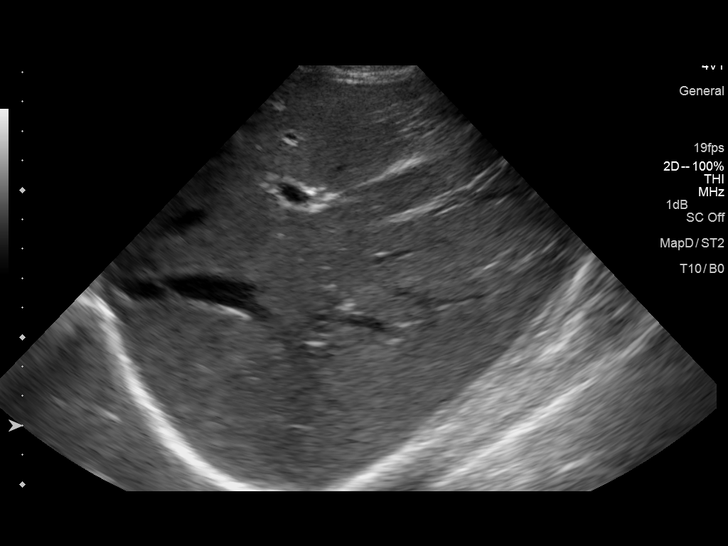
[im 34/102]
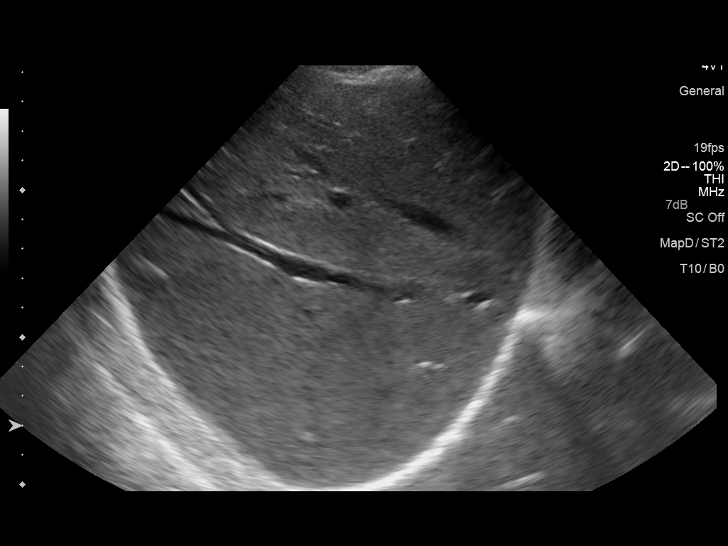
[im 38/102]
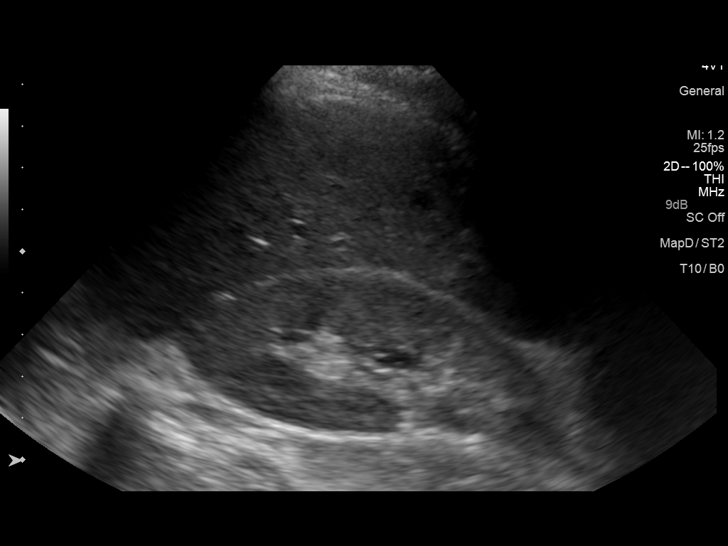
[im 47/102]
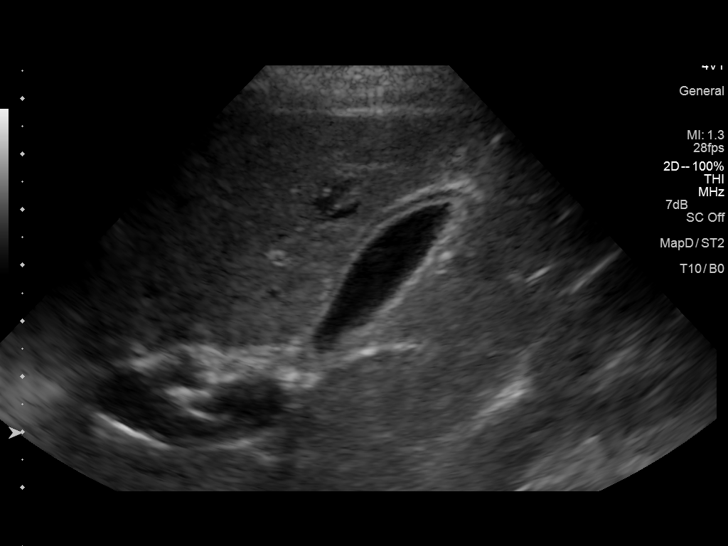
[im 55/102]
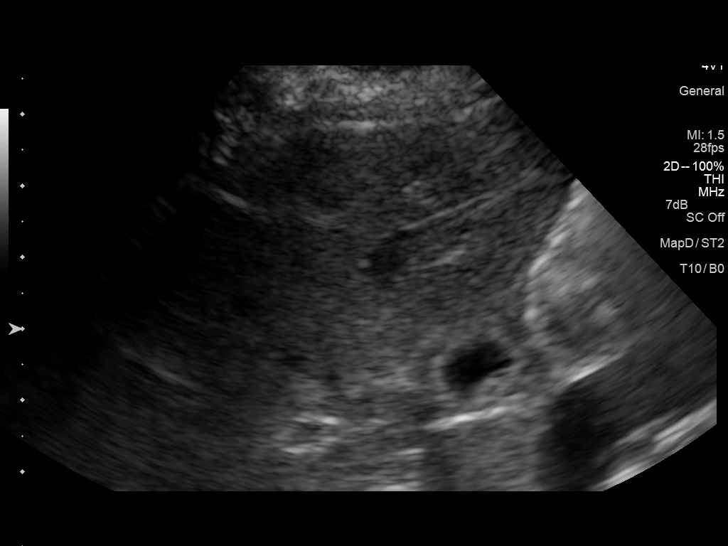
[im 64/102]
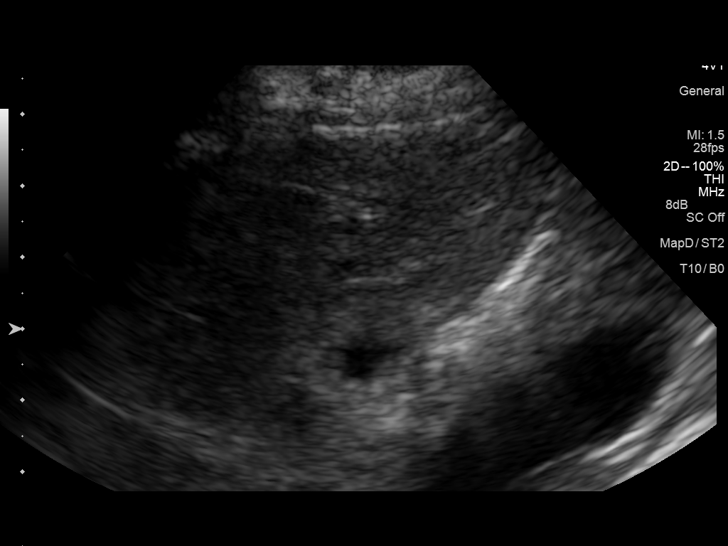
[im 68/102]
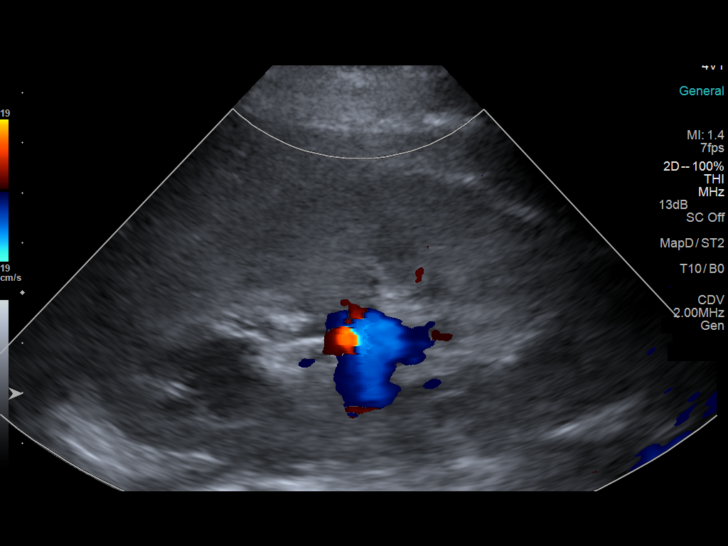
[im 76/102]
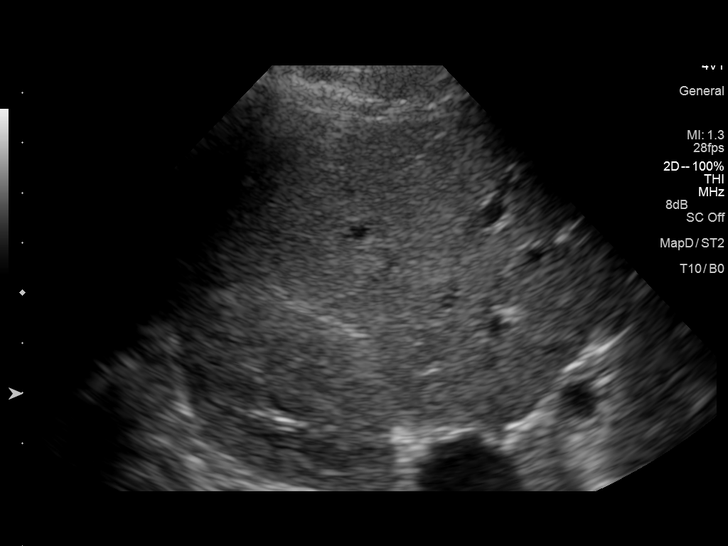
[im 85/102]
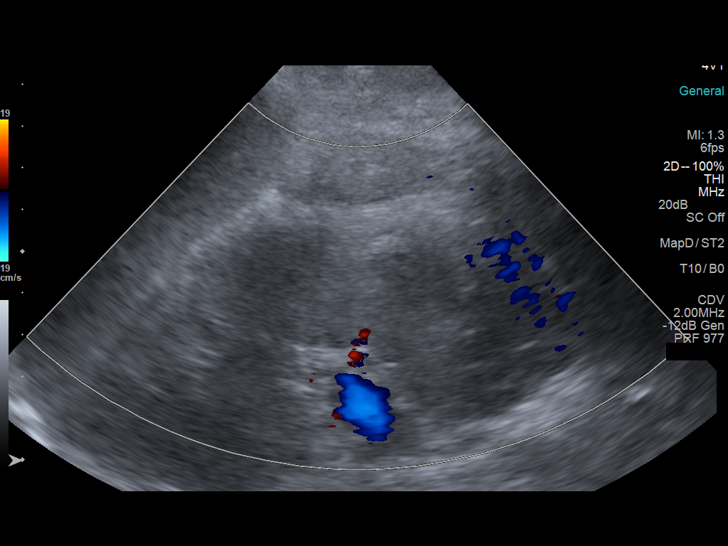
[im 93/102]
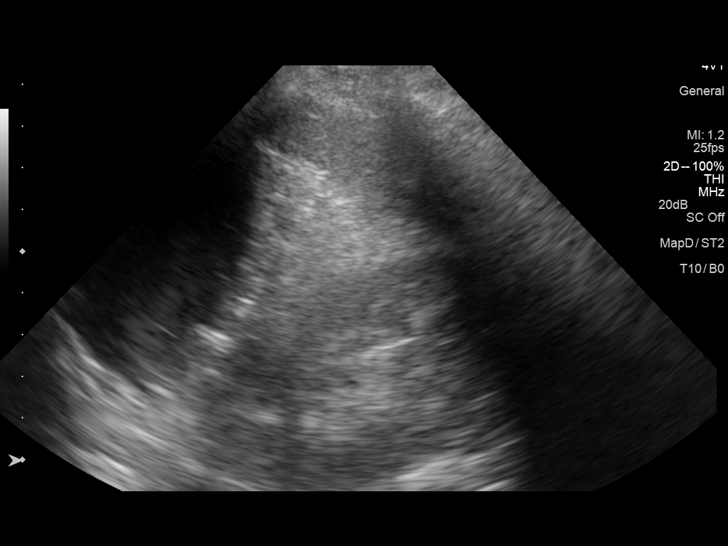
[im 102/102]
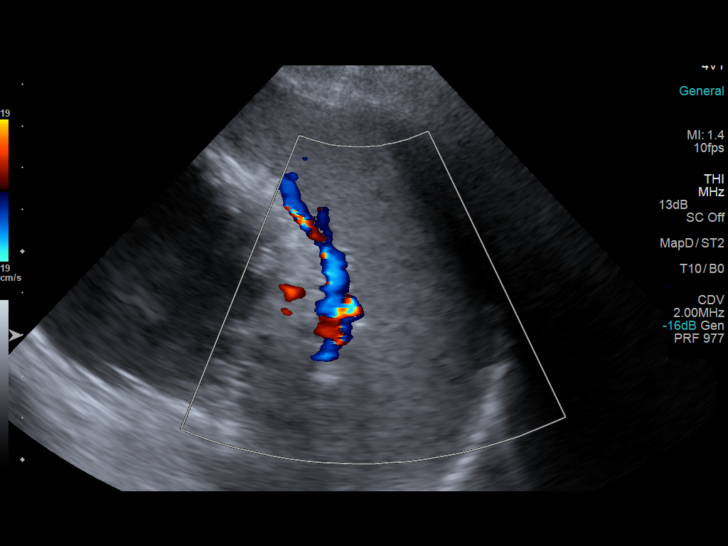

[14 of 25 positions shown; findings below may reference images not displayed]

FINDINGS: Gallbladder: No gallstones. The gallbladder is contracted which
accentuates the gallbladder wall thickness, measuring 3-4 mm.
Negative sonographic Tiger.

Common bile duct: Diameter: Normal caliber, 3 mm.

Liver: No focal lesion identified. Within normal limits in
parenchymal echogenicity.

IVC: No abnormality visualized.

Pancreas: Visualized portion unremarkable.

Spleen: Size and appearance within normal limits.

Right Kidney: Length: 11.8 cm. Echogenicity within normal limits. No
mass or hydronephrosis visualized.

Left Kidney: Length: 11.8 cm. Echogenicity within normal limits. No
mass or hydronephrosis visualized.

Abdominal aorta: No aneurysm visualized.

Other findings: None.
IMPRESSION: Contracted gallbladder which accentuates gallbladder wall thickness.
No stones or sonographic Murphy's sign.

No acute findings.

## 2016-01-03 IMAGING — US US TRANSVAGINAL NON-OB
2 series · 13 of 25 positions shown · non-contrast
Comparison: Pelvic ultrasound 03/06/2014

CLINICAL DATA: Patient with lower abdominal pain for 10 days.
History of ovarian cyst.

EXAM:
TRANSABDOMINAL AND TRANSVAGINAL ULTRASOUND OF PELVIS
DOPPLER ULTRASOUND OF OVARIES
TECHNIQUE: Both transabdominal and transvaginal ultrasound examinations of the
pelvis were performed. Transabdominal technique was performed for
global imaging of the pelvis including uterus, ovaries, adnexal
regions, and pelvic cul-de-sac.
It was necessary to proceed with endovaginal exam following the
transabdominal exam to visualize the adnexal structures. Color and
duplex Doppler ultrasound was utilized to evaluate blood flow to the
ovaries.

[Series 1: us transvaginal non-ob · 0.22mm/px · 11 of 41 slices shown (1 of 2)]
[im 1/41]
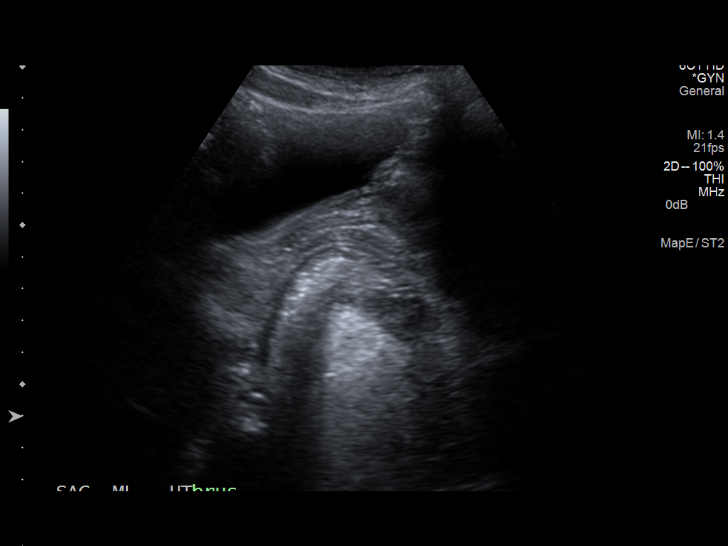
[im 4/41]
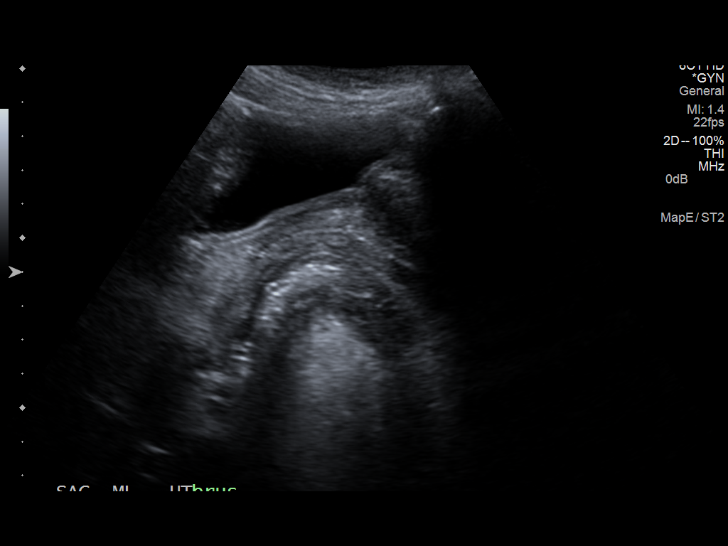
[im 8/41]
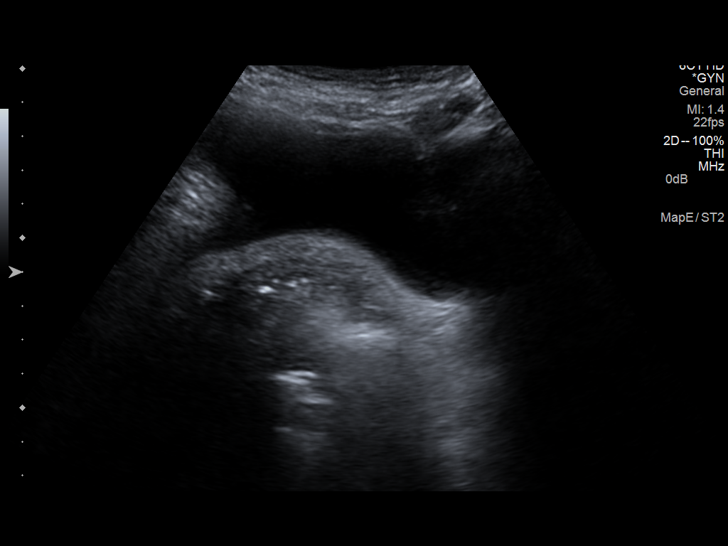
[im 12/41]
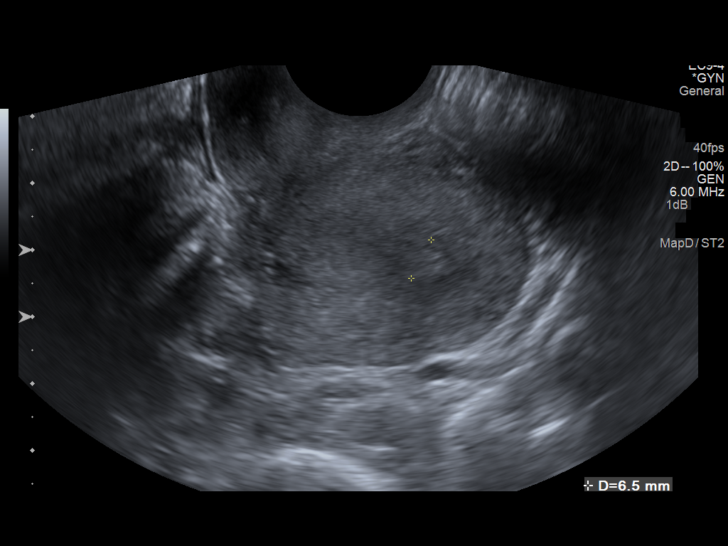
[im 16/41]
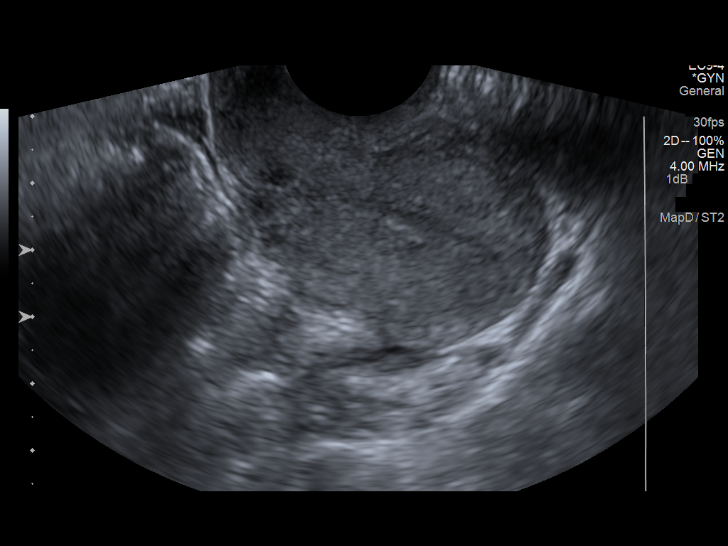
[im 20/41]
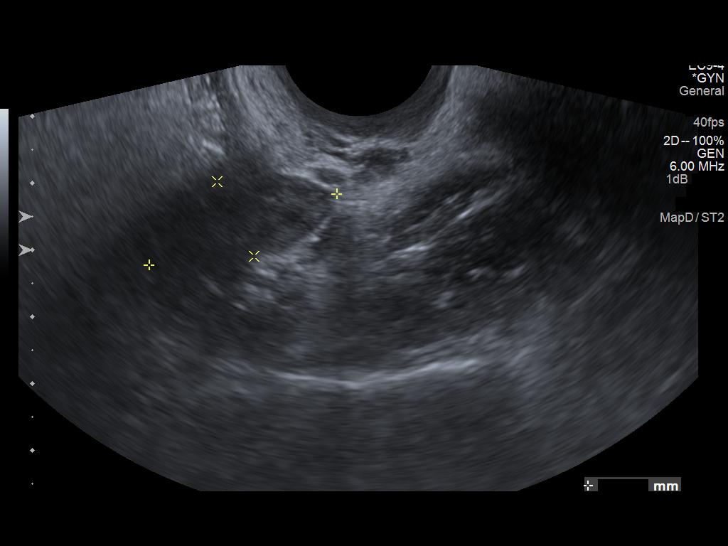
[im 23/41]
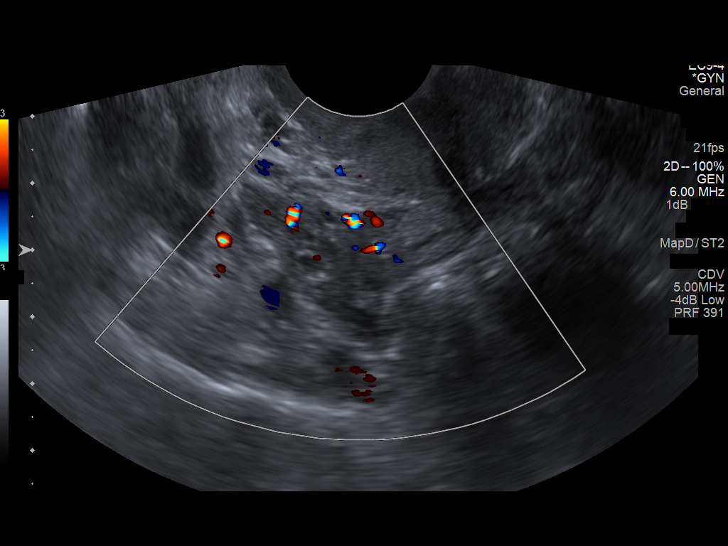
[im 27/41]
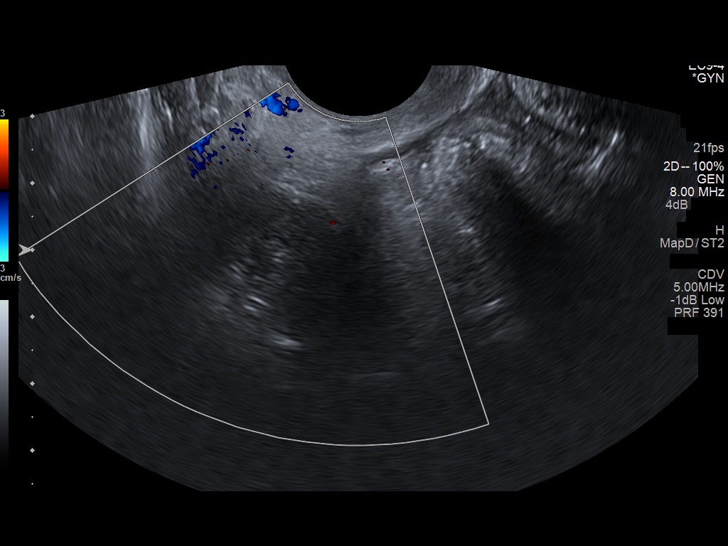
[im 31/41]
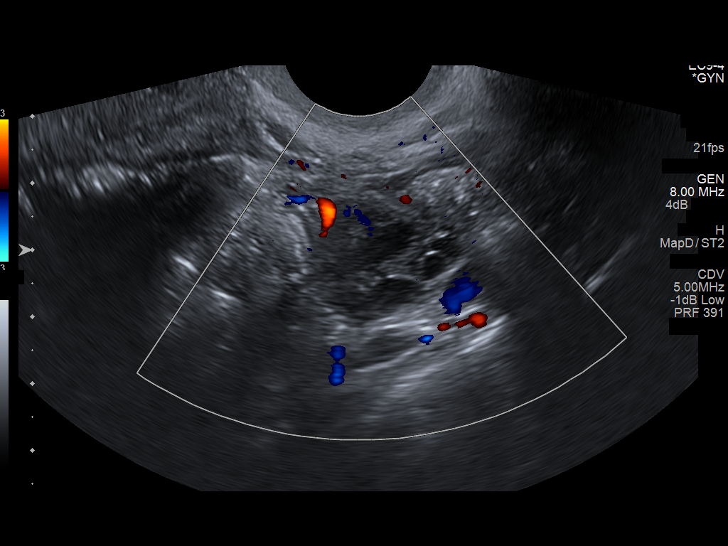
[im 35/41]
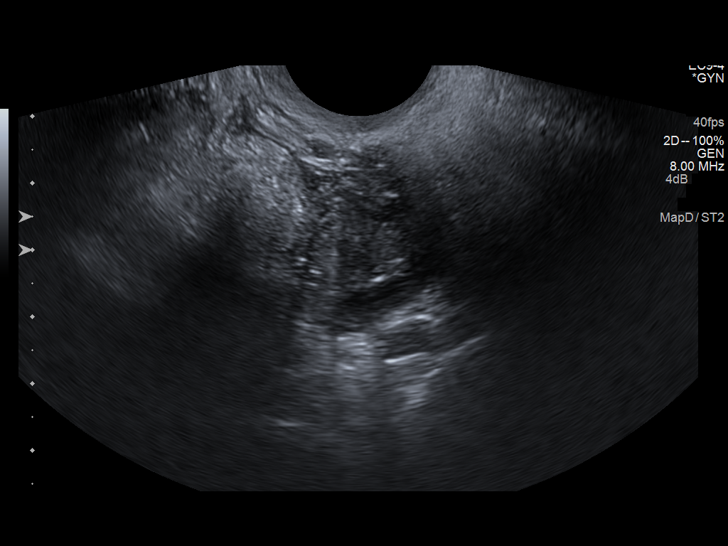
[im 39/41]
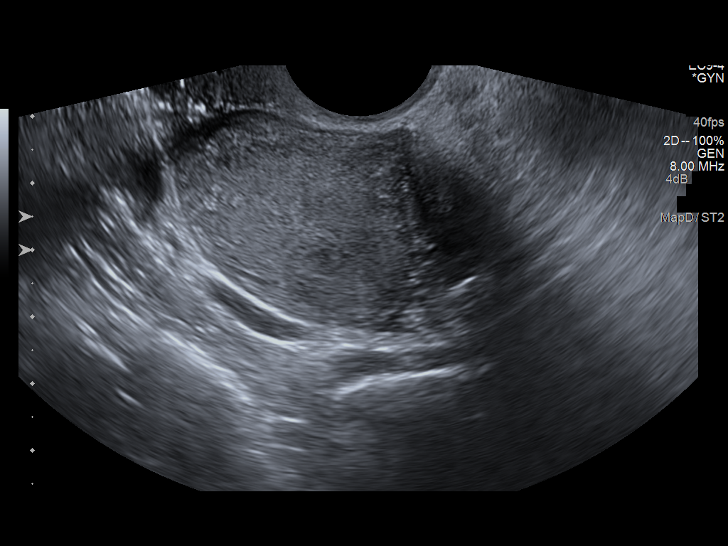

[Series 3: us transvaginal non-ob · 0.11mm/px · 2 of 5 slices shown (2 of 2)]
[im 1/5]
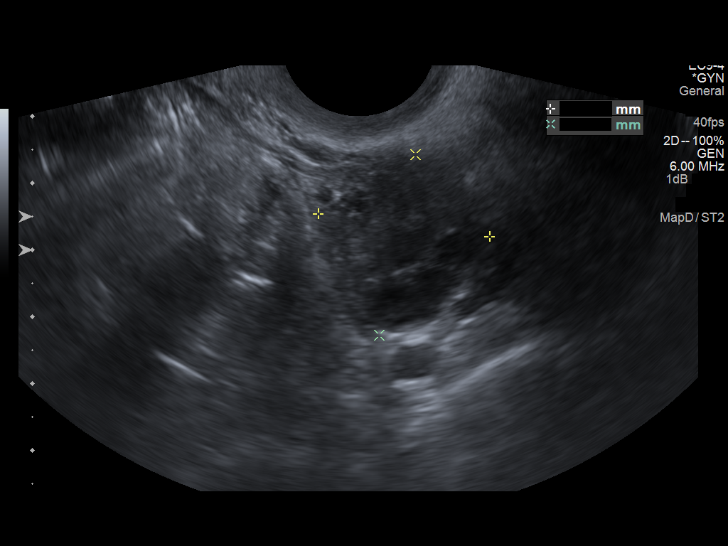
[im 5/5]
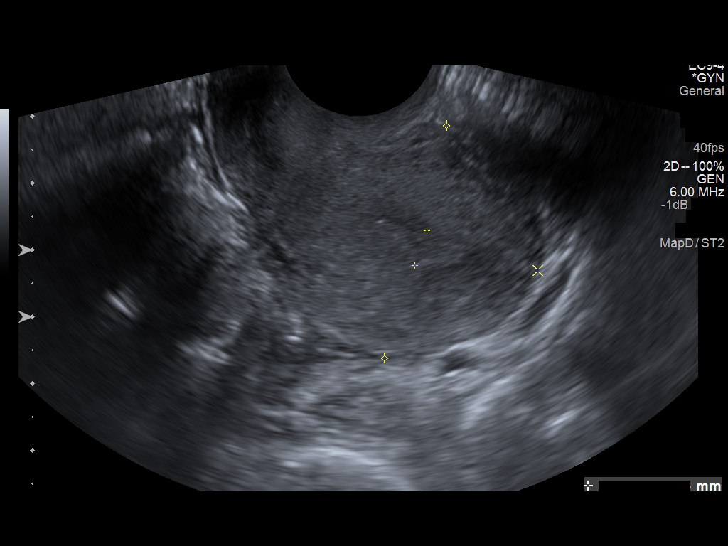

[13 of 25 positions shown; findings below may reference images not displayed]

FINDINGS: Uterus

Measurements: 6.0 x 4.5 x 3.6 cm. No fibroids or other mass
visualized. Retroverted.

Endometrium

Thickness: 6 mm.  No focal abnormality visualized.

Right ovary

Measurements: 3.0 x 1.2 x 2.5 cm. Normal appearance/no adnexal mass.

Left ovary

Measurements: 1.6 x 2.6 x 2.8 cm. Normal appearance/no adnexal mass.

Pulsed Doppler evaluation of both ovaries demonstrates normal
low-resistance arterial and venous waveforms.

Other findings

No free fluid.
IMPRESSION: Unremarkable pelvic ultrasound.

No sonographic evidence to suggest ovarian torsion.

## 2016-06-04 IMAGING — CR DG CHEST 1V PORT
1 series · 1 of 1 positions shown · non-contrast
Comparison: Chest x-ray of 01/11/2014

CLINICAL DATA: Found unresponsive, vomiting, seizure

EXAM:
PORTABLE CHEST - 1 VIEW

[ap]
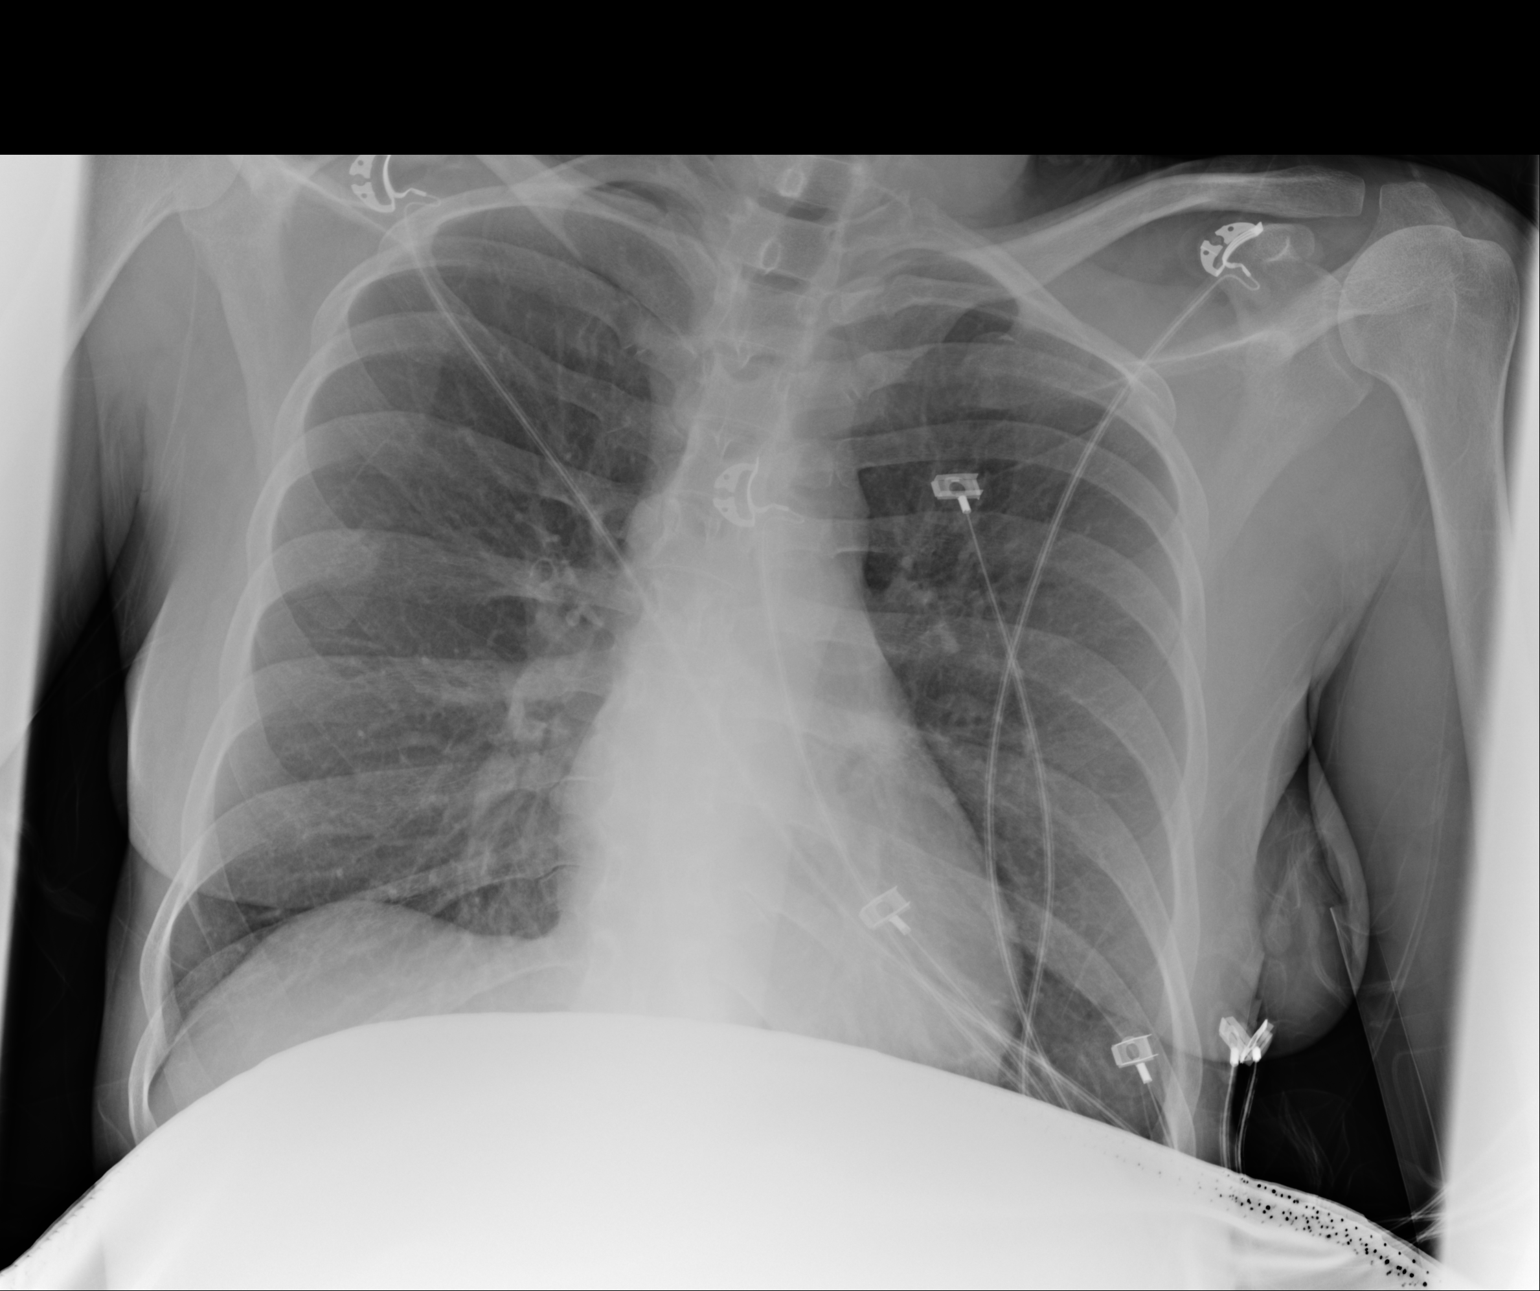

[1 of 1 positions shown; findings below may reference images not displayed]

FINDINGS: No active infiltrate or effusion is seen. Mediastinal and hilar
contours are unremarkable. The heart is within normal limits in
size. No bony abnormality is seen.
IMPRESSION: No active disease.

## 2016-08-21 ENCOUNTER — Encounter: Payer: Self-pay | Admitting: Gynecology

## 2016-09-29 ENCOUNTER — Encounter (HOSPITAL_COMMUNITY): Payer: Self-pay | Admitting: Emergency Medicine

## 2016-09-29 ENCOUNTER — Emergency Department (HOSPITAL_COMMUNITY)
Admission: EM | Admit: 2016-09-29 | Discharge: 2016-09-29 | Disposition: A | Discharge status: Home / Self Care | Attending: Emergency Medicine | Admitting: Emergency Medicine

## 2016-09-29 DIAGNOSIS — Z791 Long term (current) use of non-steroidal anti-inflammatories (NSAID): Secondary | ICD-10-CM | POA: Insufficient documentation

## 2016-09-29 DIAGNOSIS — J45909 Unspecified asthma, uncomplicated: Secondary | ICD-10-CM | POA: Insufficient documentation

## 2016-09-29 DIAGNOSIS — F17210 Nicotine dependence, cigarettes, uncomplicated: Secondary | ICD-10-CM | POA: Insufficient documentation

## 2016-09-29 DIAGNOSIS — G40909 Epilepsy, unspecified, not intractable, without status epilepticus: Secondary | ICD-10-CM | POA: Insufficient documentation

## 2016-09-29 HISTORY — DX: Other psychoactive substance abuse, uncomplicated: F19.10

## 2016-09-29 LAB — COMPREHENSIVE METABOLIC PANEL
ALBUMIN: 4 g/dL (ref 3.5–5.0)
ALK PHOS: 67 U/L (ref 38–126)
ALT: 12 U/L — ABNORMAL LOW (ref 14–54)
ANION GAP: 8 (ref 5–15)
AST: 17 U/L (ref 15–41)
BUN: 11 mg/dL (ref 6–20)
CO2: 27 mmol/L (ref 22–32)
Calcium: 9.6 mg/dL (ref 8.9–10.3)
Chloride: 107 mmol/L (ref 101–111)
Creatinine, Ser: 0.62 mg/dL (ref 0.44–1.00)
GFR calc non Af Amer: >60 mL/min (ref >60)
Glucose, Bld: 108 mg/dL — ABNORMAL HIGH (ref 65–99)
POTASSIUM: 3.9 mmol/L (ref 3.5–5.1)
SODIUM: 142 mmol/L (ref 135–145)
Total Bilirubin: 0.4 mg/dL (ref 0.3–1.2)
Total Protein: 7.7 g/dL (ref 6.5–8.1)

## 2016-09-29 LAB — CBC WITH DIFFERENTIAL/PLATELET
BASOS PCT: 0 %
Basophils Absolute: 0 10*3/uL (ref 0.0–0.1)
EOS PCT: 1 %
Eosinophils Absolute: 0.2 10*3/uL (ref 0.0–0.7)
HCT: 41.6 % (ref 36.0–46.0)
HEMOGLOBIN: 14 g/dL (ref 12.0–15.0)
Lymphocytes Relative: 30 %
Lymphs Abs: 3.4 10*3/uL (ref 0.7–4.0)
MCH: 28.3 pg (ref 26.0–34.0)
MCHC: 33.7 g/dL (ref 30.0–36.0)
MCV: 84.2 fL (ref 78.0–100.0)
Monocytes Absolute: 0.7 10*3/uL (ref 0.1–1.0)
Monocytes Relative: 6 %
NEUTROS PCT: 63 %
Neutro Abs: 6.8 10*3/uL (ref 1.7–7.7)
PLATELETS: 280 10*3/uL (ref 150–400)
RBC: 4.94 MIL/uL (ref 3.87–5.11)
RDW: 13.7 % (ref 11.5–15.5)
WBC: 11 10*3/uL — ABNORMAL HIGH (ref 4.0–10.5)

## 2016-09-29 LAB — I-STAT BETA HCG BLOOD, ED (MC, WL, AP ONLY): I-stat hCG, quantitative: <5 m[IU]/mL (ref <5)

## 2016-09-29 MED ORDER — PHENYTOIN SODIUM EXTENDED 100 MG PO CAPS: 100.0000 mg | ORAL_CAPSULE | Freq: Three times a day (TID) | ORAL | 0 refills | Status: AC

## 2016-09-29 NOTE — ED Triage Notes (Signed)
Patient c/o seizures. Per patient hx. Patient states she believes she had a seizure last night in here sleep. Patient reports waking with confusion, tremors, and body aches. Patient also states that her boyfriend states she has had "absentee" seizures and patient reports feelings of loss of time. Per patient took  1mg  xanax today prior to coming to ER for anxiety. Patient states she used to take Sheralyn BoatmanKepra but gave her hallucinations and she quite taking it. Per patient smokes marijuana to help with seizures but has recently stopped.

## 2016-09-29 NOTE — ED Notes (Signed)
Dr Kirtland BouchardK in to discuss and reassess

## 2016-09-29 NOTE — Discharge Instructions (Signed)
Follow up with a neurologist for further evaluation of your seizure disorder, you can take the dilantin for seizures in the meantime until you can see the neurologist

## 2016-09-29 NOTE — ED Notes (Signed)
Pt states that she has stress induced seizures- has a great deal to deal with- her mother has MS, her father has alzheimer's and children issues-  She laments that she has no PCP, who can prescribe her Xanax- She has tried other meds without success  "I can't find a doctor who will give me what I need. They keep trying other medicines that don't work"  [facility]- Pt then reports "it is easier to get what you need from a private doctor"

## 2016-09-29 NOTE — ED Provider Notes (Signed)
[facility]-EMER[facility]Y DEPT Provider Note   CSN: [id number] Arrival date & time: 09/29/16  1429     History   Chief Complaint Chief Complaint  Patient presents with  . Seizures    HPI Tammy Mathis is a 36 y.o. female.  HPI Pt has a history of seizure disorder.  According to the records she was hospitalized in June of 2016.  Sx were felt to be possibly related to benzo or opiate withdrawal.  Pt states she used to be on Keppra but did not like the side effects.  She stopped taking it.  She was treating herself with marijuana and that seemed to control her symptoms.  Pt feels that stress triggers auras and sx she associates with seizures.  She has tried left over xanax's and that has helped her recently.  She does not see a neurologist or any other doctor for her seizures. In the last few days her symptoms have been increasing, she has been more stressed.  She thinks she may have had a seizure last night in her sleep because of how she feels today.  She feels sore.  She denies any vomiting or diarrhea.  No headache, neck pain or fevers. Past Medical History:  Diagnosis Date  . Abdominal hernia   . Anemia    history with pregnancy, no current prob  . Anxiety    no meds  . Asthma    as child, no prob as adult, no inhaler  . Cervicitis   . Hepatitis C antibody test positive 09/30/2014   HCV viral load pending  . History of ovarian cystectomy   . Ovarian cyst   . Polysubstance abuse   . Rhabdomyolysis 09/30/2014   Likely from seizure.  . Seizure (HCC) 09/29/2014   Newly diagnosed. Presumed to be from withdrawal syndrome-opiates versus benzos.  . Smoker    5 CIGS A DAY    Patient Active Problem List   Diagnosis Date Noted  . Abdominal pain, epigastric 10/01/2014  . Rhabdomyolysis 10/01/2014  . UTI (lower urinary tract infection) 09/30/2014  . Hepatitis C antibody test positive 09/30/2014  . Elevated LFTs 09/30/2014  . Seizure (HCC) 09/29/2014  . Post-ictal state (HCC) 09/29/2014    . Leukocytosis 09/29/2014  . Polysubstance abuse 09/29/2014  . Hypokalemia 09/29/2014  . Incisional hernia, without obstruction or gangrene 04/27/2013  . Female pelvic pain 09/20/2011  . Active smoker 03/11/2011    Past Surgical History:  Procedure Laterality Date  . CERVICAL BIOPSY  W/ LOOP ELECTRODE EXCISION  02/2000  . CESAREAN SECTION  02/2001  . LAPAROSCOPY  04/24/2011   Procedure: LAPAROSCOPY OPERATIVE;  Surgeon: Ok Edwards, MD;  Location: [facility];  Service: Gynecology;  Laterality: N/A;  pelvic washings  . OVARIAN CYST REMOVAL  04/24/2011   Procedure: OVARIAN CYSTECTOMY;  Surgeon: Ok Edwards, MD;  Location: [facility];  Service: Gynecology;  Laterality: Left;  . TYMPANOSTOMY TUBE PLACEMENT    . WISDOM TOOTH EXTRACTION      OB History    Gravida Para Term Preterm AB Living   1 1 1     1    SAB TAB Ectopic Multiple Live Births           1       Home Medications    Prior to Admission medications   Medication Sig Start Date End Date Taking? Authorizing Provider  ibuprofen (ADVIL,MOTRIN) 200 MG tablet Take 600-800 mg by mouth every 6 (six) hours as needed for mild pain or  moderate pain.   Yes [provider]  phenytoin (DILANTIN) 100 MG ER capsule Take 1 capsule (100 mg total) by mouth 3 (three) times daily. 09/29/16   Linwood DibblesKnapp, Hadrian Yarbrough, MD    Family History Family History  Problem Relation Age of Onset  . Adopted: Yes  . Cancer Maternal Grandmother        OVARIAN  . Breast cancer Maternal Grandmother   . Heart disease Maternal Grandmother   . Hypertension Father   . Heart disease Maternal Grandfather   . Heart disease Paternal Grandmother   . Heart disease Paternal Grandfather     Social History Social History  Substance Use Topics  . Smoking status: Current Every Day Smoker    Packs/day: 0.50    Years: 12.00    Types: Cigarettes  . Smokeless tobacco: Never Used  . Alcohol use Yes     Comment: ocassionally     Allergies   Benzoyl peroxide and  Other   Review of Systems Review of Systems   Physical Exam Updated Vital Signs BP 118/79   Pulse 79   Temp 98.2 F (36.8 C) (Oral)   Resp 18   Ht 1.664 m (5' 5.5")   Wt 59 kg (130 lb)   LMP 09/13/2016   SpO2 97%   BMI 21.30 kg/m   Physical Exam  Constitutional: She appears well-developed and well-nourished. No distress.  HENT:  Head: Normocephalic and atraumatic.  Right Ear: External ear normal.  Left Ear: External ear normal.  Poor dentition  Eyes: Conjunctivae are normal. Right eye exhibits no discharge. Left eye exhibits no discharge. No scleral icterus.  Neck: Neck supple. No tracheal deviation present.  Cardiovascular: Normal rate, regular rhythm and intact distal pulses.   Pulmonary/Chest: Effort normal and breath sounds normal. No stridor. No respiratory distress. She has no wheezes. She has no rales.  Abdominal: Soft. Bowel sounds are normal. She exhibits no distension. There is no tenderness. There is no rebound and no guarding.  Musculoskeletal: She exhibits no edema or tenderness.  Neurological: She is alert. She has normal strength. No cranial nerve deficit (no facial droop, extraocular movements intact, no slurred speech) or sensory deficit. She exhibits normal muscle tone. She displays no seizure activity. Coordination normal.  Skin: Skin is warm and dry. No rash noted.  Psychiatric: She has a normal mood and affect.  Nursing note and vitals reviewed.    ED Treatments / Results  Labs (all labs ordered are listed, but only abnormal results are displayed) Labs Reviewed  CBC WITH DIFFERENTIAL/PLATELET - Abnormal; Notable for the following:       Result Value   WBC 11.0 (*)    All other components within normal limits  COMPREHENSIVE METABOLIC PANEL - Abnormal; Notable for the following:    Glucose, Bld 108 (*)    ALT 12 (*)    All other components within normal limits  I-STAT BETA HCG BLOOD, ED ([facility], [facility], [facility] ONLY)    EKG  EKG  Interpretation  Date/Time:  Sunday September 29 2016 14:37:20 EDT Ventricular Rate:  87 PR Interval:  142 QRS Duration: 84 QT Interval:  376 QTC Calculation: 452 R Axis:   13 Text Interpretation:  Normal sinus rhythm Possible Left atrial enlargement T wave abnormality, consider anterior ischemia , new since prior ECG Abnormal ECG Confirmed by Linwood DibblesKnapp, Einar Nolasco [phone]([id number]) on 09/29/2016 2:42:21 PM        Procedures Procedures (including critical care time)  Medications Ordered in ED Medications - No  data to display   Initial Impression / Assessment and Plan / ED Course  I have reviewed the triage vital signs and the nursing notes.  Pertinent labs & imaging results that were available during my care of the patient were reviewed by me and considered in my medical decision making (see chart for details).   patient presents to the emergency room for evaluation of what she feels is recurrent seizures. She was without seizure activity here in the ED. Patient does have a history of seizures. According to the records she was admitted to the hospital in June 2016 with status epilepticus.  It sounds like they felt her seizures may have been related to substance abuse problems.  Patient was taking Keppra. She stopped taking this because of side effects. Patient states she feels like her symptoms are triggered by anxiety. She's taken Xanax previously. She feels like something along those lines would help prevent further seizures. I explained to the patient that this type of medication may trigger withdrawal seizures. I would not recommend this for her. She started seeing [facility] for mental health for her anxiety. I recommend she discuss further treatment options with them.  I will give the patient prescription for Dilantin since she does not want to take the Keppra. I will give her a referral to neurology for further testing and evaluation. Final Clinical Impressions(s) / ED Diagnoses   Final diagnoses:  Seizure  disorder (HCC)    New Prescriptions New Prescriptions   PHENYTOIN (DILANTIN) 100 MG ER CAPSULE    Take 1 capsule (100 mg total) by mouth 3 (three) times daily.     Linwood Dibbles, MD 09/29/16 [phone]

## 2016-10-07 ENCOUNTER — Emergency Department (HOSPITAL_COMMUNITY)

## 2016-10-07 ENCOUNTER — Encounter (HOSPITAL_COMMUNITY): Payer: Self-pay | Admitting: Emergency Medicine

## 2016-10-07 ENCOUNTER — Emergency Department (HOSPITAL_COMMUNITY)
Admission: EM | Admit: 2016-10-07 | Discharge: 2016-10-07 | Disposition: A | Discharge status: Home / Self Care | Attending: Emergency Medicine | Admitting: Emergency Medicine

## 2016-10-07 DIAGNOSIS — J45909 Unspecified asthma, uncomplicated: Secondary | ICD-10-CM | POA: Insufficient documentation

## 2016-10-07 DIAGNOSIS — F1910 Other psychoactive substance abuse, uncomplicated: Secondary | ICD-10-CM

## 2016-10-07 DIAGNOSIS — Z79899 Other long term (current) drug therapy: Secondary | ICD-10-CM | POA: Insufficient documentation

## 2016-10-07 DIAGNOSIS — F419 Anxiety disorder, unspecified: Secondary | ICD-10-CM | POA: Insufficient documentation

## 2016-10-07 DIAGNOSIS — J069 Acute upper respiratory infection, unspecified: Secondary | ICD-10-CM

## 2016-10-07 DIAGNOSIS — F1210 Cannabis abuse, uncomplicated: Secondary | ICD-10-CM | POA: Insufficient documentation

## 2016-10-07 DIAGNOSIS — F1110 Opioid abuse, uncomplicated: Secondary | ICD-10-CM | POA: Insufficient documentation

## 2016-10-07 DIAGNOSIS — F17210 Nicotine dependence, cigarettes, uncomplicated: Secondary | ICD-10-CM | POA: Insufficient documentation

## 2016-10-07 DIAGNOSIS — Z791 Long term (current) use of non-steroidal anti-inflammatories (NSAID): Secondary | ICD-10-CM | POA: Insufficient documentation

## 2016-10-07 LAB — COMPREHENSIVE METABOLIC PANEL
ALT: 50 U/L (ref 14–54)
AST: 43 U/L — ABNORMAL HIGH (ref 15–41)
Albumin: 4.2 g/dL (ref 3.5–5.0)
Alkaline Phosphatase: 85 U/L (ref 38–126)
Anion gap: 9 (ref 5–15)
BUN: 8 mg/dL (ref 6–20)
CALCIUM: 9.8 mg/dL (ref 8.9–10.3)
CHLORIDE: 104 mmol/L (ref 101–111)
CO2: 24 mmol/L (ref 22–32)
Creatinine, Ser: 0.59 mg/dL (ref 0.44–1.00)
Glucose, Bld: 94 mg/dL (ref 65–99)
Potassium: 3.5 mmol/L (ref 3.5–5.1)
Sodium: 137 mmol/L (ref 135–145)
Total Bilirubin: 0.6 mg/dL (ref 0.3–1.2)
Total Protein: 7.9 g/dL (ref 6.5–8.1)

## 2016-10-07 LAB — CBC WITH DIFFERENTIAL/PLATELET
Basophils Absolute: 0 10*3/uL (ref 0.0–0.1)
Basophils Relative: 0 %
Eosinophils Absolute: 0.1 10*3/uL (ref 0.0–0.7)
Eosinophils Relative: 1 %
HCT: 44.1 % (ref 36.0–46.0)
Hemoglobin: 14.8 g/dL (ref 12.0–15.0)
LYMPHS ABS: 2.4 10*3/uL (ref 0.7–4.0)
LYMPHS PCT: 25 %
MCH: 28.4 pg (ref 26.0–34.0)
MCHC: 33.6 g/dL (ref 30.0–36.0)
MCV: 84.6 fL (ref 78.0–100.0)
MONOS PCT: 8 %
Monocytes Absolute: 0.7 10*3/uL (ref 0.1–1.0)
Neutro Abs: 6.3 10*3/uL (ref 1.7–7.7)
Neutrophils Relative %: 66 %
Platelets: 282 10*3/uL (ref 150–400)
RBC: 5.21 MIL/uL — ABNORMAL HIGH (ref 3.87–5.11)
RDW: 13.8 % (ref 11.5–15.5)
WBC: 9.5 10*3/uL (ref 4.0–10.5)

## 2016-10-07 LAB — RAPID URINE DRUG SCREEN, HOSP PERFORMED
Amphetamines: NOT DETECTED
BENZODIAZEPINES: NOT DETECTED
Barbiturates: NOT DETECTED
Cocaine: NOT DETECTED
Opiates: POSITIVE — AB
Tetrahydrocannabinol: POSITIVE — AB

## 2016-10-07 LAB — I-STAT TROPONIN, ED: TROPONIN I, POC: 0.01 ng/mL (ref 0.00–0.08)

## 2016-10-07 MED ORDER — ALBUTEROL SULFATE HFA 108 (90 BASE) MCG/ACT IN AERS: 1.0000 | INHALATION_SPRAY | Freq: Four times a day (QID) | RESPIRATORY_TRACT | 0 refills | Status: AC | PRN

## 2016-10-07 MED ORDER — KETOROLAC TROMETHAMINE 30 MG/ML IJ SOLN
30.0000 mg | Freq: Once | INTRAMUSCULAR | Status: AC
  Administered 2016-10-07: 30 mg via INTRAMUSCULAR
  Filled 2016-10-07: qty 1

## 2016-10-07 MED ORDER — KETOROLAC TROMETHAMINE 30 MG/ML IJ SOLN: 30.0000 mg | Freq: Once | INTRAMUSCULAR | Status: DC

## 2016-10-07 MED ORDER — PREDNISONE 10 MG PO TABS: 40.0000 mg | ORAL_TABLET | Freq: Every day | ORAL | 0 refills | Status: AC

## 2016-10-07 MED ORDER — ACETAMINOPHEN 325 MG PO TABS
ORAL_TABLET | ORAL | Status: AC
  Filled 2016-10-07: qty 2

## 2016-10-07 MED ORDER — ACETAMINOPHEN 325 MG PO TABS
650.0000 mg | ORAL_TABLET | Freq: Once | ORAL | Status: AC
  Administered 2016-10-07: 650 mg via ORAL

## 2016-10-07 NOTE — ED Notes (Signed)
Pt visibly upset and tearfull; stating she was in terrible pain and that "it feels all of her bones are breaking".

## 2016-10-07 NOTE — Discharge Instructions (Signed)
Take steroids for 4 days, and use the inhaler when needed for shortness of breath or chest tightness. Stay well-hydrated. Avoid excess smoke inhalation, including cigarettes and marijuana, as this can make your symptoms worse. Use ibuprofen and Tylenol for pain control. Return to the emergency department if your symptoms worsen, or if there is no improvement within the next week.

## 2016-10-07 NOTE — ED Notes (Signed)
Pt hooked up to pulse ox and bp

## 2016-10-07 NOTE — ED Notes (Signed)
Patient continues to moan in discomfort in lobby.

## 2016-10-07 NOTE — ED Notes (Addendum)
Pt given work note with d/c paperwork. Pt ambulated to lobby with significant other. Pt had no questions at discharge.

## 2016-10-07 NOTE — ED Notes (Addendum)
Pt denied reduction in pain after medication administration

## 2016-10-07 NOTE — ED Triage Notes (Addendum)
Pt to ED with c/o productive cough, nasal congestion, and chest congestion.  Also c/o hurting all over.,  Onset 2 weeks ago.  Pt requesting something for pain, Tylenol 650mg  PO given

## 2016-10-07 NOTE — ED Provider Notes (Signed)
[facility] Provider Note   CSN: [id number] Arrival date & time: 10/07/16  1604     History   Chief Complaint Chief Complaint  Patient presents with  . Nasal Congestion  . Generalized Body Aches    HPI Tammy Mathis is a 36 y.o. female presented with two-week history of productive cough, nasal congestion, chest congestion, and myalgias. Pt states that she feels like the pain is 'in her bones.'  She reports a chest pain that is worse with coughing. She denies fever or chills. She denies nausea, vomiting, abdominal pain. She denies neck stiffness or rash. She does not have a history of asthma or other lung condition. She states she has been using ibuprofen and "all of the over-the-counter decongestants" with no relief.  Additionally patient states that she has a seizure disorder, and feels like she might be having seizures. She reports that she has time, and when she thinks about it she is more stiff after that. She was started on a new seizure medicine, Dilantin, when she went [employer] last week. Mom has not seen any of the seizure activity.  Outside the room, mom states that patient has had issues with substance abuse. Patient is supposed to appear in court tomorrow regarding charges for this issue.   HPI  Past Medical History:  Diagnosis Date  . Abdominal hernia   . Anemia    history with pregnancy, no current prob  . Anxiety    no meds  . Asthma    as child, no prob as adult, no inhaler  . Cervicitis   . Hepatitis C antibody test positive 09/30/2014   HCV viral load pending  . History of ovarian cystectomy   . Ovarian cyst   . Polysubstance abuse   . Rhabdomyolysis 09/30/2014   Likely from seizure.  . Seizure (HCC) 09/29/2014   Newly diagnosed. Presumed to be from withdrawal syndrome-opiates versus benzos.  . Smoker    5 CIGS A DAY    Patient Active Problem List   Diagnosis Date Noted  . Abdominal pain, epigastric 10/01/2014  . Rhabdomyolysis 10/01/2014    . UTI (lower urinary tract infection) 09/30/2014  . Hepatitis C antibody test positive 09/30/2014  . Elevated LFTs 09/30/2014  . Seizure (HCC) 09/29/2014  . Post-ictal state (HCC) 09/29/2014  . Leukocytosis 09/29/2014  . Polysubstance abuse 09/29/2014  . Hypokalemia 09/29/2014  . Incisional hernia, without obstruction or gangrene 04/27/2013  . Female pelvic pain 09/20/2011  . Active smoker 03/11/2011    Past Surgical History:  Procedure Laterality Date  . CERVICAL BIOPSY  W/ LOOP ELECTRODE EXCISION  02/2000  . CESAREAN SECTION  02/2001  . LAPAROSCOPY  04/24/2011   Procedure: LAPAROSCOPY OPERATIVE;  Surgeon: Ok Edwards, MD;  Location: [facility];  Service: Gynecology;  Laterality: N/A;  pelvic washings  . OVARIAN CYST REMOVAL  04/24/2011   Procedure: OVARIAN CYSTECTOMY;  Surgeon: Ok Edwards, MD;  Location: [facility];  Service: Gynecology;  Laterality: Left;  . TYMPANOSTOMY TUBE PLACEMENT    . WISDOM TOOTH EXTRACTION      OB History    Gravida Para Term Preterm AB Living   1 1 1     1    SAB TAB Ectopic Multiple Live Births           1       Home Medications    Prior to Admission medications   Medication Sig Start Date End Date Taking? Authorizing Provider  albuterol (PROVENTIL HFA;VENTOLIN  HFA) 108 (90 Base) MCG/ACT inhaler Inhale 1-2 puffs into the lungs every 6 (six) hours as needed for wheezing or shortness of breath. 10/07/16   Trampus Mcquerry, PA-C  ibuprofen (ADVIL,MOTRIN) 200 MG tablet Take 600-800 mg by mouth every 6 (six) hours as needed for mild pain or moderate pain.    [provider]  phenytoin (DILANTIN) 100 MG ER capsule Take 1 capsule (100 mg total) by mouth 3 (three) times daily. 09/29/16   Linwood DibblesKnapp, Jon, MD  predniSONE (DELTASONE) 10 MG tablet Take 4 tablets (40 mg total) by mouth daily. 10/07/16 10/11/16  Australia Droll, PA-C    Family History Family History  Problem Relation Age of Onset  . Adopted: Yes  . Cancer Maternal Grandmother         OVARIAN  . Breast cancer Maternal Grandmother   . Heart disease Maternal Grandmother   . Hypertension Father   . Heart disease Maternal Grandfather   . Heart disease Paternal Grandmother   . Heart disease Paternal Grandfather     Social History Social History  Substance Use Topics  . Smoking status: Current Every Day Smoker    Packs/day: 0.50    Years: 12.00    Types: Cigarettes  . Smokeless tobacco: Never Used  . Alcohol use Yes     Comment: ocassionally     Allergies   Benzoyl peroxide and Other   Review of Systems Review of Systems   Physical Exam Updated Vital Signs BP 124/89 (BP Location: Right Arm)   Pulse 84   Temp 98.1 F (36.7 C) (Oral)   Resp 16   Ht 5' 5.5" (1.664 m)   Wt 61.2 kg (135 lb)   LMP 10/04/2016   SpO2 98%   BMI 22.12 kg/m   Physical Exam  Constitutional: She is oriented to person, place, and time. She appears well-developed and well-nourished.  HENT:  Head: Normocephalic and atraumatic.  Eyes: Conjunctivae and EOM are normal. Pupils are equal, round, and reactive to light.  Neck: Normal range of motion. Neck supple.  Cardiovascular: Normal rate, regular rhythm, normal heart sounds and intact distal pulses.   Pulmonary/Chest: Effort normal and breath sounds normal. No respiratory distress. She has no wheezes. She exhibits tenderness.  Tenderness to palpation of the sternum and central chest.  Abdominal: Soft. Bowel sounds are normal. She exhibits no distension. There is no tenderness.  Musculoskeletal: Normal range of motion.  Neurological: She is alert and oriented to person, place, and time.  Skin: Skin is warm and dry.  Nursing note and vitals reviewed.    ED Treatments / Results  Labs (all labs ordered are listed, but only abnormal results are displayed) Labs Reviewed  CBC WITH DIFFERENTIAL/PLATELET - Abnormal; Notable for the following:       Result Value   RBC 5.21 (*)    All other components within normal limits    COMPREHENSIVE METABOLIC PANEL - Abnormal; Notable for the following:    AST 43 (*)    All other components within normal limits  RAPID URINE DRUG SCREEN, HOSP PERFORMED - Abnormal; Notable for the following:    Opiates POSITIVE (*)    Tetrahydrocannabinol POSITIVE (*)    All other components within normal limits  I-STAT TROPOININ, ED    EKG  EKG Interpretation None       Radiology Dg Chest 2 View  Result Date: 10/07/2016 CLINICAL DATA:  Jayesh chin, cough productive of green brown material for 2 weeks, some mid chest pain radiating  to RIGHT-side of back, smoker, history asthma EXAM: CHEST  2 VIEW COMPARISON:  09/29/2014 FINDINGS: Normal heart size, mediastinal contours, and pulmonary vascularity. Mild chronic bronchitic changes. Lungs otherwise clear. No pleural effusion or pneumothorax. No acute bony abnormalities. IMPRESSION: Mild chronic bronchitic changes without acute infiltrate. Electronically Signed   By: Ulyses Southward M.D.   On: 10/07/2016 17:29    Procedures Procedures (including critical care time)  Medications Ordered in ED Medications  acetaminophen (TYLENOL) 325 MG tablet (not administered)  acetaminophen (TYLENOL) tablet 650 mg (650 mg Oral Given 10/07/16 1652)  ketorolac (TORADOL) 30 MG/ML injection 30 mg (30 mg Intramuscular Given 10/07/16 2011)     Initial Impression / Assessment and Plan / ED Course  I have reviewed the triage vital signs and the nursing notes.  Pertinent labs & imaging results that were available during my care of the patient were reviewed by me and considered in my medical decision making (see chart for details).     Patient presenting with 2 week history of symptoms consistent with a viral illness. Patient writhing on the bed demanding pain medicine. Physical exam showed patient without wheezing and with good air movement, however this was after patient had been coughing. RN reported wheezing prior to patient coughing. Chest tender to  palpation, and chest pain is significant when patient is coughing, not when she is at rest. Labs reassuring, as there is no elevated white count, H&H normal, and troponin negative. EKG reassuring. Chest x-ray shows possible mild bronchitis, but no signs of pneumonia. Patient with extensive history of drug use in the past. Will order UDS.   UDS positive for opiates and marijuana. Will discharge patient with perception for prednisone and inhaler as treatment for viral URI. Discussed case with attending, Dr. Freida Busman agrees to plan. Discussed with pt the treatment plan, and pain control with Tylenol and ibuprofen. Also discussed findings and UDS with patient. Return precautions given. Patient states she understands and agrees to plan.  Final Clinical Impressions(s) / ED Diagnoses   Final diagnoses:  Viral upper respiratory tract infection  Substance abuse    New Prescriptions Discharge Medication List as of 10/07/2016  9:33 PM    START taking these medications   Details  albuterol (PROVENTIL HFA;VENTOLIN HFA) 108 (90 Base) MCG/ACT inhaler Inhale 1-2 puffs into the lungs every 6 (six) hours as needed for wheezing or shortness of breath., Starting Mon 10/07/2016, Print    predniSONE (DELTASONE) 10 MG tablet Take 4 tablets (40 mg total) by mouth daily., Starting Mon 10/07/2016, Until Fri 10/11/2016, Print         Versie Fleener, PA-C 10/07/16 2144    Lorre Nick, MD 10/09/16 2321

## 2018-06-13 IMAGING — DX DG CHEST 2V
2 series · 2 of 2 positions shown · non-contrast
Comparison: 09/29/2014

CLINICAL DATA: Hopf chin, cough productive of green brown
material for 2 weeks, some mid chest pain radiating to RIGHT-side of
back, smoker, history asthma

EXAM:
CHEST  2 VIEW

[chest pa]
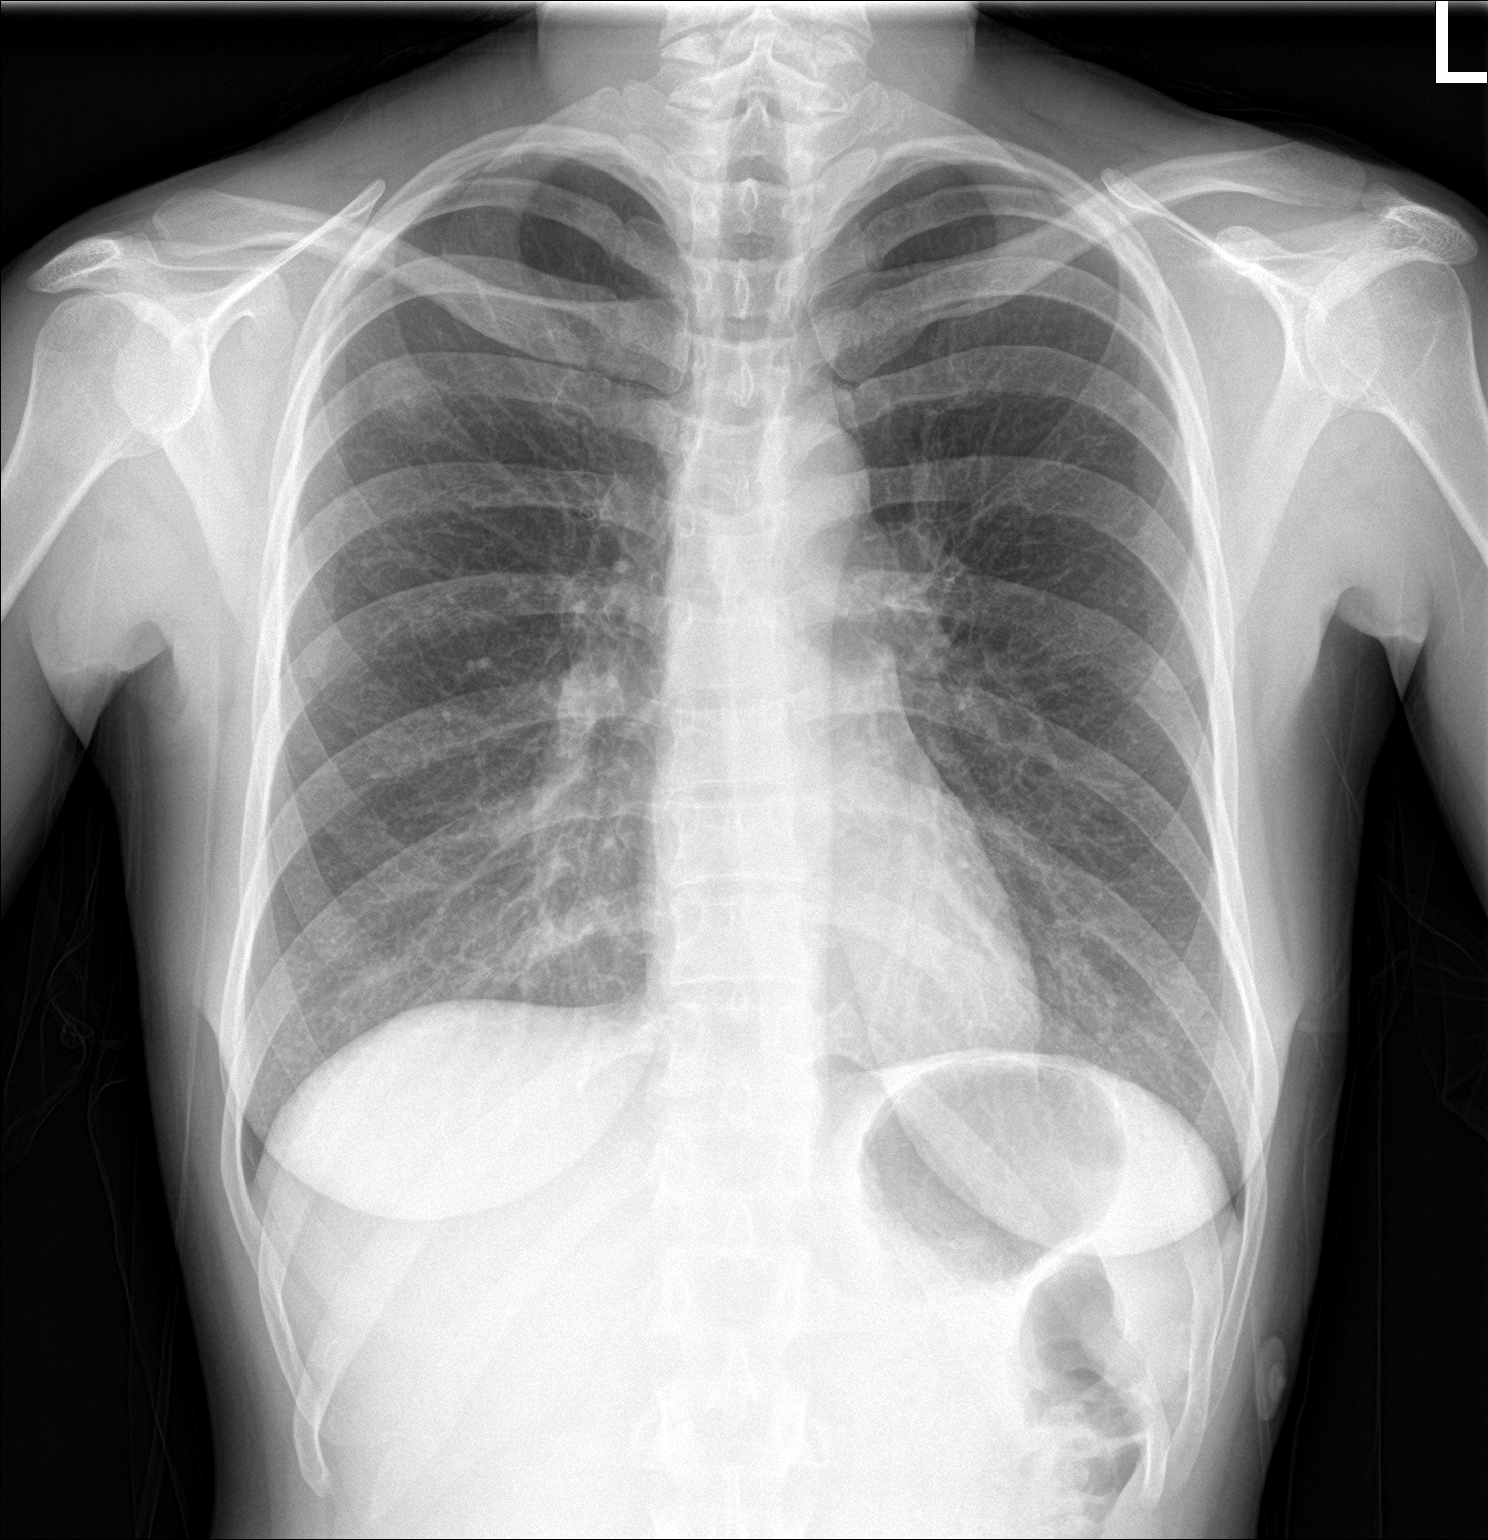

[chest lat]
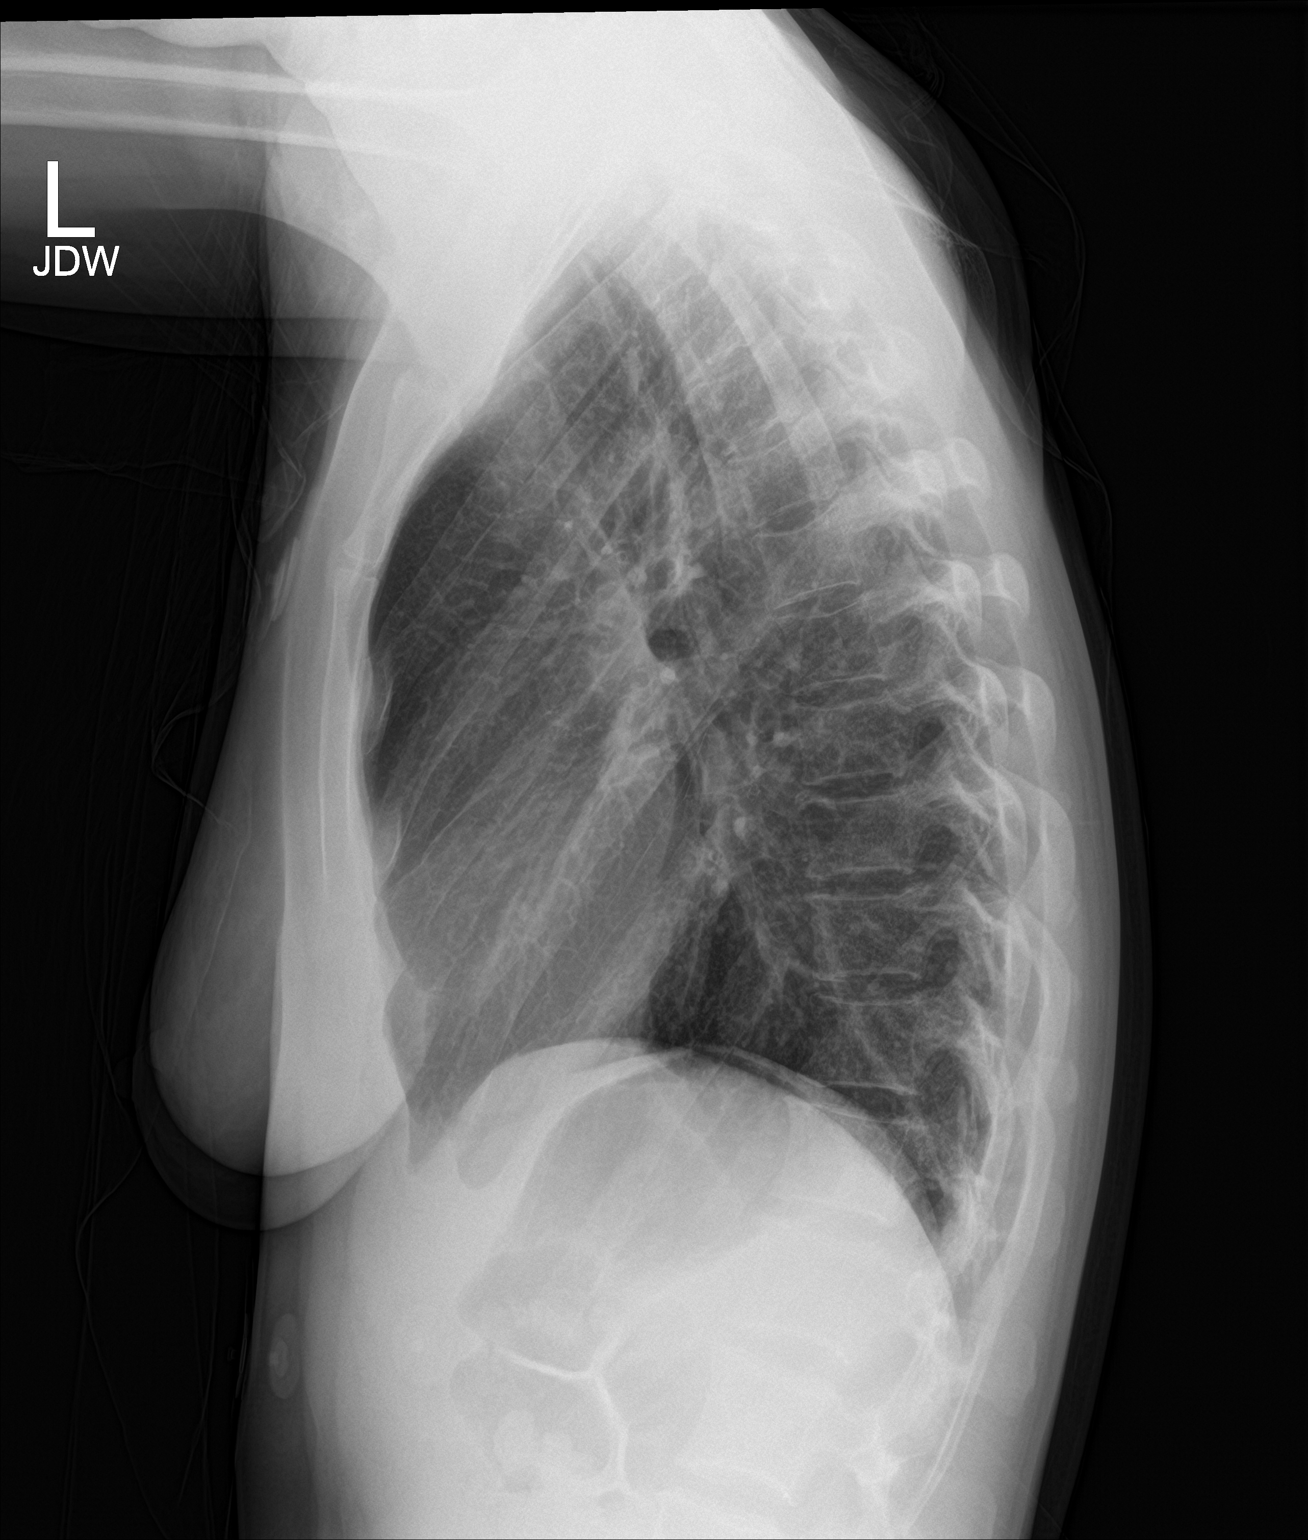

[2 of 2 positions shown; findings below may reference images not displayed]

FINDINGS: Normal heart size, mediastinal contours, and pulmonary vascularity.

Mild chronic bronchitic changes.

Lungs otherwise clear.

No pleural effusion or pneumothorax.

No acute bony abnormalities.
IMPRESSION: Mild chronic bronchitic changes without acute infiltrate.

## 2019-09-29 ENCOUNTER — Encounter: Payer: Self-pay | Admitting: Pediatric Intensive Care

## 2019-09-29 NOTE — Congregational Nurse Program (Signed)
  Dept: [phone]   Congregational Nurse Program Note  Date of Encounter: 09/29/2019  Past Medical History: Past Medical History:  Diagnosis Date  . Abdominal hernia   . Anemia    history with pregnancy, no current prob  . Anxiety    no meds  . Asthma    as child, no prob as adult, no inhaler  . Cervicitis   . Hepatitis C antibody test positive 09/30/2014   HCV viral load pending  . History of ovarian cystectomy   . Ovarian cyst   . Polysubstance abuse   . Rhabdomyolysis 09/30/2014   Likely from seizure.  . Seizure (HCC) 09/29/2014   Newly diagnosed. Presumed to be from withdrawal syndrome-opiates versus benzos.  . Smoker    5 CIGS A DAY    Encounter Details: I clinic for SSPs. No medical needs. Client states food insecurity and was given information regarding [facility] [employer] as well as food distribution in [employer].  Shann Medal Rn BSN CNP [phone]

## 2019-10-28 ENCOUNTER — Encounter (HOSPITAL_COMMUNITY): Payer: Self-pay | Admitting: Emergency Medicine

## 2019-10-28 ENCOUNTER — Emergency Department (HOSPITAL_COMMUNITY)
Admission: EM | Admit: 2019-10-28 | Discharge: 2019-10-28 | Disposition: A | Discharge status: Home / Self Care | Payer: Self-pay | Attending: Emergency Medicine | Admitting: Emergency Medicine

## 2019-10-28 DIAGNOSIS — F419 Anxiety disorder, unspecified: Secondary | ICD-10-CM | POA: Insufficient documentation

## 2019-10-28 DIAGNOSIS — Z5321 Procedure and treatment not carried out due to patient leaving prior to being seen by health care provider: Secondary | ICD-10-CM | POA: Insufficient documentation

## 2019-10-28 NOTE — ED Triage Notes (Addendum)
Pt reports hx of opiate addiction, last used 8 hours ago. States that she also took acid 12 hours ago. Pt very anxious, unable to stay in room or sit still. Denies SI. Pt also states she may leave AMA, just wanted to get her vitals checked and does not know if she wants to stay. A/ox4. resp e/u.

## 2019-10-28 NOTE — ED Notes (Signed)
Pt called multiple times for VS with no response, not visualized in the ED at this time.

## 2019-10-28 NOTE — ED Notes (Signed)
Pt walked out during triage stating she had called a friend and was too anxious to stay here.

## 2021-04-18 ENCOUNTER — Ambulatory Visit (HOSPITAL_COMMUNITY)
Admission: RE | Admit: 2021-04-18 | Discharge: 2021-04-18 | Disposition: A | Discharge status: Home / Self Care | Payer: Self-pay | Attending: Psychiatry | Admitting: Psychiatry

## 2021-04-18 DIAGNOSIS — F1510 Other stimulant abuse, uncomplicated: Secondary | ICD-10-CM | POA: Insufficient documentation

## 2021-04-18 DIAGNOSIS — F331 Major depressive disorder, recurrent, moderate: Secondary | ICD-10-CM | POA: Insufficient documentation

## 2021-04-18 DIAGNOSIS — F1190 Opioid use, unspecified, uncomplicated: Secondary | ICD-10-CM

## 2021-04-18 NOTE — H&P (Signed)
Behavioral Health Medical Screening Exam  Visit Diagnoses:   -MDD (major depressive disorder), recurrent episode, moderate (HCC)  -Opioid use disorder   -Methamphetamine use (HCC)  Tammy Mathis is a 41 y.o. female with documented past psychiatric history significant for polysubstance abuse, documented past medical history significant for incisional hernia, seizure, and rhabdomyolysis, and reported past medical history significant for chronic low back pain and sciatica/lumbago, who presents to the [facility] behavioral health [facility]) as a voluntary walk-in accompanied by her mother Tammy Mathis: [phone]) for concerns regarding substance abuse, depressive symptoms, and housing issues.  Per patient's request, patient's mother not present during patient's evaluation.    Patient states "I have no support, I'm homeless, trying to make changes to make things better."  Patient reports that she contacted her mother earlier today because she was hoping to temporarily live with her mother while she is trying to make some positive changes in her life. Patient states that her mother will unfortunately not let her stay with her at this time, which patient reports is why patient's mother brought her here to [facility].  Patient denies SI on exam.  She denies experiencing any SI recently.  She denies history of any past suicide attempts.  She denies history of self-injurious behavior via intentionally cutting or burning herself.  She denies HI or AVH.  She denies paranoia. Patient describes her sleep as poor, about 6 to 8 hours per night.  She endorses anhedonia and feelings of guilt, hopelessness, and worthlessness over the past few months.  She endorses declines in energy, concentration, and appetite over the past few months as well.  She denies any significant weight changes over the past few months.  Patient reports that she has an extensive history of opioid and heroin abuse, which she reports began due to  right lower back pain and sciatica.  She reports that she relapsed on heroin and fentanyl 5 days ago in order to try to numb her back pain noted above.  She states that since her relapse 5 days ago, she has used an unknown amount of heroin and fentanyl twice within the past 5 days.  She reports that her last heroin/fentanyl use was an unknown amount that she used within the past 24 hours.  She reports that she uses heroin and fentanyl via intravenous route.  Patient reports that prior to her relapse 5 days ago, she had been completely sober from heroin/opioid/fentanyl for 3 weeks. Patient reports that prior to this 3-week period of sobriety, she had been injecting heroin daily.  Patient denies any current p.o. opioid prescription use.  No controlled substance prescriptions noted per PDMP review.  Patient denies any recent history of opioid withdrawal symptoms.  Patient does report that she had a seizure in 2016 that she reports was secondary to cocaine and opioid withdrawal.  She denies any additional seizures since 2016.  Patient reports that she uses alcohol 1-2 times per year and reports that her last alcohol consumption was a few months ago.  She endorses smoking about 1 pack of cigarettes per day.  She endorses smoking an unknown amount of marijuana once every few months and reports that she last used an unknown amount of marijuana earlier today.  She reports that she was previously using marijuana daily from the age of 16 up until a few years ago.  Patient also endorses using methamphetamine a few times per month via intravenous route, but does not provide details regarding quantity of use.  She  reports that her last meth use was 2 days ago.  Patient denies any additional substance use at this time.  Patient reports that she received residential substance abuse rehabilitation treatment [facility] [place] [facility] [place].  Patient denies taking any psychotropic medications or other  home medications at this time.  She denies having a psychiatrist or therapist at this time.  She denies history of any previous inpatient psychiatric hospitalizations. Patient reports that she has been homeless for the past few years.  She reports that she has been attempting to stay with friends over the past few weeks.  Patient is currently unemployed at this time.  She denies access to firearms.  Patient verbally contracts for safety with this Clinical research associatewriter.  Patient declined to give permission for this provider to contact her mother or anyone for collateral information at this time.  On exam, patient is sitting upright, appearing disheveled and fairly groomed, in no acute distress.  Eye contact is good.  Speech is clear and coherent with normal rate and volume.  Mood is depressed with congruent affect.  Thought process is coherent, goal directed, and linear.  Patient is alert and oriented x4, cooperative, and answers all questions appropriately during the evaluation.  No indication that patient is responding to internal/external stimuli on exam.  No delusional thought content noted.  Total Time spent with patient: 30 minutes  Psychiatric Specialty Exam:  Presentation  General Appearance: Disheveled; Fairly Groomed Eye Contact:Good Speech:Clear and Coherent; Normal Rate Speech Volume:Normal Handedness:No data recorded  Mood and Affect  Mood:Depressed Affect:Congruent  Thought Process  Thought Processes:Coherent; Goal Directed; Linear Descriptions of Associations:Intact  Orientation:Full (Time, Place and Person)  Thought Content:Logical; WDL  History of Schizophrenia/Schizoaffective disorder:No data recorded Duration of Psychotic Symptoms:No data recorded Hallucinations:Hallucinations: None  Ideas of Reference:None  Suicidal Thoughts:Suicidal Thoughts: No  Homicidal Thoughts:Homicidal Thoughts: No   Sensorium  Memory:Immediate Fair; Recent Fair; Remote  Fair Judgment:Fair Insight:Fair  Executive Functions  Concentration:Good Attention Span:Good Recall:Fair Fund of Knowledge:Fair Language:Good  Psychomotor Activity  Psychomotor Activity:Psychomotor Activity: Normal  Assets  Assets:Communication Skills; Desire for Improvement; Financial Resources/Insurance; Leisure Time; Physical Health; Resilience  Sleep  Sleep:Sleep: Poor Number of Hours of Sleep: 6   Physical Exam: Physical Exam Vitals reviewed.  Constitutional:      General: She is not in acute distress.    Appearance: She is not ill-appearing, toxic-appearing or diaphoretic.  HENT:     Head: Normocephalic and atraumatic.     Right Ear: External ear normal.     Left Ear: External ear normal.     Nose: Nose normal.  Eyes:     General:        Right eye: No discharge.        Left eye: No discharge.     Conjunctiva/sclera: Conjunctivae normal.  Cardiovascular:     Rate and Rhythm: Normal rate.  Pulmonary:     Effort: Pulmonary effort is normal. No respiratory distress.  Musculoskeletal:        General: Normal range of motion.     Cervical back: Normal range of motion.  Skin:    Comments: Multiple red track marks noted on patient's bilateral arms and dorsal surface of patient's right hand with no signs of drainage, discharge, or infection noted at this time.  Neurological:     General: No focal deficit present.     Mental Status: She is alert and oriented to person, place, and time.  Comments: No tremor noted.   Psychiatric:        Attention and Perception: Attention and perception normal. She does not perceive auditory or visual hallucinations.        Mood and Affect: Mood is depressed.        Speech: Speech normal.        Behavior: Behavior normal. Behavior is not agitated, slowed, aggressive, withdrawn, hyperactive or combative. Behavior is cooperative.        Thought Content: Thought content is not paranoid or delusional. Thought content does not include  homicidal or suicidal ideation.     Comments: Affect mood-congruent.    Review of Systems  Constitutional:  Positive for malaise/fatigue. Negative for chills, diaphoresis, fever and weight loss.  HENT:  Negative for congestion.   Respiratory:  Negative for cough and shortness of breath.   Cardiovascular:  Negative for chest pain and palpitations.  Gastrointestinal:  Negative for abdominal pain, constipation, diarrhea, nausea and vomiting.  Musculoskeletal:  Negative for joint pain and myalgias.  Neurological:  Positive for seizures. Negative for dizziness, tremors and headaches.       Patient does report that she had a seizure in 2016 that she reports was secondary to cocaine and opioid withdrawal.  She denies any additional seizures since 2016.   Psychiatric/Behavioral:  Positive for depression and substance abuse. Negative for hallucinations, memory loss and suicidal ideas. The patient is nervous/anxious and has insomnia.   All other systems reviewed and are negative.  Vitals: Blood pressure (!) 140/105, pulse 94, temperature 97.9 F (36.6 C), temperature source Oral, resp. rate 18, SpO2 100 %. There is no height or weight on file to calculate BMI.  Musculoskeletal: Strength & Muscle Tone: within normal limits Gait & Station: normal Patient leans: N/A   Recommendations:  Based on my evaluation the patient does not appear to have an emergency medical condition.  Patient denies SI, HI, history of self-injurious behaviors or suicide attempts, AVH, or paranoia.  She denies experiencing any recent SI.  Based on these factors, patient does not meet inpatient psychiatric treatment criteria at this time.  With that being said, based on patient's demographic information and current presentation, which includes worsening depressive symptoms and substance abuse, the patient does appear to meet criteria for [facility] behavioral health urgent care [facility]) facility based crisis [facility]) admission  at this time for further treatment for her depression and substance abuse issues.  [facility] admission was offered to the patient and patient declined this offer.  Patient then stated that she did not want to be admitted to any facility or receive any residential treatment at this time.  Patient states that she would prefer to receive treatment for her mental health and substance abuse issues without having to stay at a facility and she is also requesting resources for shelters since she is not able to stay with her mother at this time.  Patient is not suicidal, homicidal, delusional, paranoid, or psychotic on exam.  Patient verbally contracts for safety with this Clinical research associate.  Patient denies any history of any past suicide attempts or self-injurious behaviors.  Based on these factors, the patient is not an imminent danger to herself or others at this time and thus, patient does not meet IVC criteria at this time, and patient is appropriate for discharge at this time with appropriate follow-up recommendations/resources and safety planning in place (see details below).  Recommend that patient participate in substance abuse treatment, whether that be outpatient, residential, or [facility]  based substance abuse treatment.  Resources for outpatient and residential substance abuse treatment facilities, as well as NA programs provided to the patient.  Additionally, information regarding [facility] provided to the patient for patient to utilize in the case that patient changes her mind and decides that she wishes to participate in [facility] in the future.  Shelter resources provided to the patient as well.  Additionally, recommend that patient participate in outpatient therapy and psychiatry services.  Resource/schedule information for [facility] second-floor outpatient medication management/therapy walk-in hours provided to the patient.  Additional resources for  outpatient [facility] psychiatry and therapy facilities provided to the patient for the patient to utilize to schedule psychiatry/therapy appointments in the case that patient is unable to follow up with [facility].  Behavioral health crisis resources and PHP/IOP resources provided to patient.   Safety planning done at length with the patient regarding appropriate actions to take/resources to utilize [facility], [facility], [place], nearest ED, suicide prevention Lifeline) if the patient becomes suicidal or homicidal, if the patient's condition rapidly deteriorates/worsen/does not improve, or if the patient begins to experience a mental health crisis.  Address and contact information for [facility] and suicide prevention Lifeline provided to the patient as well.  Patient verbalizes understanding and agreement of the overall plan and recommendations, including safety plan.  All patient's questions answered and concerns addressed.  Patient discharged.  Patient reports that her mother will transport her to her destination of choice upon discharge.  Jaclyn ShaggyCody W Grason Brailsford, PA-C 04/18/2021, 11:19 PM

## 2022-09-01 ENCOUNTER — Other Ambulatory Visit: Payer: Self-pay

## 2022-09-01 ENCOUNTER — Encounter (HOSPITAL_COMMUNITY): Payer: Self-pay

## 2022-09-01 ENCOUNTER — Emergency Department (HOSPITAL_COMMUNITY): Payer: BC Managed Care – PPO

## 2022-09-01 ENCOUNTER — Emergency Department (HOSPITAL_COMMUNITY)
Admission: EM | Admit: 2022-09-01 | Discharge: 2022-09-01 | Disposition: A | Discharge status: Home / Self Care | Payer: BC Managed Care – PPO | Attending: Emergency Medicine | Admitting: Emergency Medicine

## 2022-09-01 DIAGNOSIS — S99922A Unspecified injury of left foot, initial encounter: Secondary | ICD-10-CM | POA: Diagnosis present

## 2022-09-01 DIAGNOSIS — M7989 Other specified soft tissue disorders: Secondary | ICD-10-CM | POA: Diagnosis not present

## 2022-09-01 DIAGNOSIS — Y9301 Activity, walking, marching and hiking: Secondary | ICD-10-CM | POA: Insufficient documentation

## 2022-09-01 DIAGNOSIS — S92345A Nondisplaced fracture of fourth metatarsal bone, left foot, initial encounter for closed fracture: Secondary | ICD-10-CM | POA: Diagnosis not present

## 2022-09-01 DIAGNOSIS — X501XXA Overexertion from prolonged static or awkward postures, initial encounter: Secondary | ICD-10-CM | POA: Diagnosis not present

## 2022-09-01 MED ORDER — HYDROCODONE-ACETAMINOPHEN 5-325 MG PO TABS: 1.0000 | ORAL_TABLET | ORAL | 0 refills | Status: AC | PRN

## 2022-09-01 MED ORDER — HYDROCODONE-ACETAMINOPHEN 5-325 MG PO TABS
1.0000 | ORAL_TABLET | Freq: Once | ORAL | Status: AC
  Administered 2022-09-01: 1 via ORAL
  Filled 2022-09-01: qty 1

## 2022-09-01 NOTE — ED Provider Notes (Signed)
[place] EMERGENCY DEPARTMENT AT [facility] Provider Note   CSN: [id number] Arrival date & time: 09/01/22  2128     History  Chief Complaint  Patient presents with   Foot Pain    Tammy Mathis is a 42 y.o. female.  The history is provided by the patient and medical records.  Foot Pain   42 year old female presenting to the ED with left foot injury.  She slipped while walking down a hill last evening and rolled her left foot.  She had to get up and continue walking so was not able to rest.  States she tried to stay off her feet today and applied ice but has had progressively worsening swelling and bruising to the top of her foot.  She denies any numbness or tingling.  She has been able to walk still but with increased pain.  Home Medications Prior to Admission medications   Medication Sig Start Date End Date Taking? Authorizing Provider  HYDROcodone-acetaminophen (NORCO/VICODIN) 5-325 MG tablet Take 1 tablet by mouth every 4 (four) hours as needed. 09/01/22  Yes Garlon Hatchet, PA-C  albuterol (PROVENTIL HFA;VENTOLIN HFA) 108 (90 Base) MCG/ACT inhaler Inhale 1-2 puffs into the lungs every 6 (six) hours as needed for wheezing or shortness of breath. 10/07/16   Caccavale, Sophia, PA-C  phenytoin (DILANTIN) 100 MG ER capsule Take 1 capsule (100 mg total) by mouth 3 (three) times daily. Patient not taking: Reported on 09/01/2022 09/29/16   Linwood Dibbles, MD      Allergies    Benzoyl peroxide and Other    Review of Systems   Review of Systems  Musculoskeletal:  Positive for arthralgias.  All other systems reviewed and are negative.   Physical Exam Updated Vital Signs BP (!) 134/91   Pulse 93   Temp 98.1 F (36.7 C) (Oral)   Resp 20   Ht 5\' 6"  (1.676 m)   Wt 60.8 kg   LMP 08/11/2022 (Approximate)   SpO2 95%   BMI 21.63 kg/m   Physical Exam Vitals and nursing note reviewed.  Constitutional:      Appearance: She is well-developed.  HENT:     Head:  Normocephalic and atraumatic.  Eyes:     Conjunctiva/sclera: Conjunctivae normal.     Pupils: Pupils are equal, round, and reactive to light.  Cardiovascular:     Rate and Rhythm: Normal rate and regular rhythm.     Heart sounds: Normal heart sounds.  Pulmonary:     Effort: Pulmonary effort is normal.     Breath sounds: Normal breath sounds.  Abdominal:     General: Bowel sounds are normal.     Palpations: Abdomen is soft.  Musculoskeletal:        General: Normal range of motion.     Cervical back: Normal range of motion.     Comments: Diffuse swelling to left dorsal foot, there is bruising around proximal toes and along dorsal midfoot, there is no acute deformity, DP pulse intact, moving toes as normal, normal distal sensation  Skin:    General: Skin is warm and dry.  Neurological:     Mental Status: She is alert and oriented to person, place, and time.     ED Results / Procedures / Treatments   Labs (all labs ordered are listed, but only abnormal results are displayed) Labs Reviewed - No data to display  EKG None  Radiology DG Foot Complete Left  Result Date: 09/01/2022 CLINICAL DATA:  Slip and  fall with pain, bruising and swelling on top of foot. EXAM: LEFT FOOT - COMPLETE 3+ VIEW COMPARISON:  None Available. FINDINGS: Nondisplaced but minimally comminuted proximal fourth metatarsal fracture. No convincing intra-articular extension. Question of additional fracture of the proximal third metatarsal. No dislocation, the alignment is normal. Dorsal soft tissue edema overlying the forefoot. IMPRESSION: 1. Nondisplaced but minimally comminuted proximal fourth metatarsal fracture. 2. Question of additional fracture of the proximal third metatarsal. Electronically Signed   By: Narda Rutherford M.D.   On: 09/01/2022 22:12    Procedures Procedures    Medications Ordered in ED Medications  HYDROcodone-acetaminophen (NORCO/VICODIN) 5-325 MG per tablet 1 tablet (1 tablet Oral Given  09/01/22 2258)    ED Course/ Medical Decision Making/ A&P                             Medical Decision Making Amount and/or Complexity of Data Reviewed Radiology: ordered and independent interpretation performed.  Risk Prescription drug management.   42 y.o. F here with left foot pain/swelling after a fall yesterday on a hill.  She does have quite a bit of swelling to dorsal foot along with bruising.  Foot remains NVI.  X-ray with non-displaced 4th metatarsal fracture, possibly 3rd metatarsal fracture as well.  Patient unfortunately is homeless and needs to continue walking to get around so was placed into [facility] walker.  She is given orthopedic follow-up and short supply pain medication.  Can return here for new concerns.  Final Clinical Impression(s) / ED Diagnoses Final diagnoses:  Closed nondisplaced fracture of fourth metatarsal bone of left foot, initial encounter    Rx / DC Orders ED Discharge Orders          Ordered    HYDROcodone-acetaminophen (NORCO/VICODIN) 5-325 MG tablet  Every 4 hours PRN        09/01/22 2312              Garlon Hatchet, PA-C 09/01/22 2318    Loetta Rough, MD 09/02/22 0009

## 2022-09-01 NOTE — ED Triage Notes (Addendum)
Pt arrives c/o L foot pain. States that she slipped on a hill last night and injured it at that time. Has had to walk on it all day, which has increased the pain. No meds PTA. Swelling and bruising noted to top of foot.

## 2022-09-01 NOTE — Discharge Instructions (Signed)
Take the prescribed medication as directed. Follow-up with Dr. Eulah Pont-- call his office to get appt scheduled. Return to the ED for new or worsening symptoms.
